# Patient Record
Sex: Male | Born: 1980 | Race: White | Hispanic: No | Marital: Single | Smoking: Never smoker
Health system: Southern US, Community
[De-identification: ages and names within clinical notes are randomized; demographics above are authoritative.]

---
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
## 2009-10-20 ENCOUNTER — Emergency Department (HOSPITAL_COMMUNITY): Admission: EM | Admit: 2009-10-20 | Discharge: 2009-10-20 | Payer: Self-pay | Admitting: Emergency Medicine

## 2010-03-20 LAB — CBC
HCT: 48.8 % (ref 39.0–52.0)
Hemoglobin: 16.8 g/dL (ref 13.0–17.0)
MCH: 32 pg (ref 26.0–34.0)
MCHC: 34.3 g/dL (ref 30.0–36.0)
MCV: 93.2 fL (ref 78.0–100.0)
Platelets: 191 10*3/uL (ref 150–400)
RBC: 5.24 MIL/uL (ref 4.22–5.81)
RDW: 12.5 % (ref 11.5–15.5)
WBC: 6.7 10*3/uL (ref 4.0–10.5)

## 2010-03-20 LAB — DIFFERENTIAL
Basophils Absolute: 0 10*3/uL (ref 0.0–0.1)
Basophils Relative: 1 % (ref 0–1)
Eosinophils Absolute: 0.2 10*3/uL (ref 0.0–0.7)
Eosinophils Relative: 2 % (ref 0–5)
Lymphocytes Relative: 20 % (ref 12–46)
Lymphs Abs: 1.3 10*3/uL (ref 0.7–4.0)
Monocytes Absolute: 0.8 10*3/uL (ref 0.1–1.0)
Monocytes Relative: 12 % (ref 3–12)
Neutro Abs: 4.4 10*3/uL (ref 1.7–7.7)
Neutrophils Relative %: 66 % (ref 43–77)

## 2010-03-20 LAB — POCT I-STAT, CHEM 8
BUN: 12 mg/dL (ref 6–23)
Calcium, Ion: 1.15 mmol/L (ref 1.12–1.32)
Chloride: 103 mEq/L (ref 96–112)
Creatinine, Ser: 0.9 mg/dL (ref 0.4–1.5)
Glucose, Bld: 116 mg/dL — ABNORMAL HIGH (ref 70–99)
HCT: 52 % (ref 39.0–52.0)
Hemoglobin: 17.7 g/dL — ABNORMAL HIGH (ref 13.0–17.0)
Potassium: 3.8 mEq/L (ref 3.5–5.1)
Sodium: 140 mEq/L (ref 135–145)
TCO2: 27 mmol/L (ref 0–100)

## 2014-09-27 ENCOUNTER — Ambulatory Visit (INDEPENDENT_AMBULATORY_CARE_PROVIDER_SITE_OTHER): Payer: BC Managed Care – PPO | Admitting: Emergency Medicine

## 2014-09-27 VITALS — BP 114/72 | HR 47 | Temp 98.1°F | Resp 16 | Ht 73.0 in | Wt 180.0 lb

## 2014-09-27 DIAGNOSIS — L600 Ingrowing nail: Secondary | ICD-10-CM | POA: Diagnosis not present

## 2014-09-27 DIAGNOSIS — Z23 Encounter for immunization: Secondary | ICD-10-CM

## 2014-09-27 NOTE — Progress Notes (Signed)
Procedure: Risk and benefits discussed and verbal consent obtained. The patient was anesthetized using 5 cc of 2% lidocaine via digital block.  The medial 1/4 of the right great toe nail was lifted and cut away. No retained toenail on inspection. Xeroform gauze placed. Clean dressing applied.  Deliah Boston, MS, PA-C   7:34 PM, 09/27/2014

## 2014-09-27 NOTE — Patient Instructions (Signed)
Infected Ingrown Toenail °An infected ingrown toenail occurs when the nail edge grows into the skin and bacteria invade the area. Symptoms include pain, tenderness, swelling, and pus drainage from the edge of the nail. Poorly fitting shoes, minor injuries, and improper cutting of the toenail may also contribute to the problem. You should cut your toenails squarely instead of rounding the edges. Do not cut them too short. Avoid tight or pointed toe shoes. Sometimes the ingrown portion of the nail must be removed. If your toenail is removed, it can take 3-4 months for it to re-grow. °HOME CARE INSTRUCTIONS  °· Soak your infected toe in warm water for 20-30 minutes, 2 to 3 times a day. °· Packing or dressings applied to the area should be changed daily. °· Take medicine as directed and finish them. °· Reduce activities and keep your foot elevated when able to reduce swelling and discomfort. Do this until the infection gets better. °· Wear sandals or go barefoot as much as possible while the infected area is sensitive. °· See your caregiver for follow-up care in 2-3 days if the infection is not better. °SEEK MEDICAL CARE IF:  °Your toe is becoming more red, swollen or painful. °MAKE SURE YOU:  °· Understand these instructions. °· Will watch your condition. °· Will get help right away if you are not doing well or get worse. °Document Released: 01/31/2004 Document Revised: 03/17/2011 Document Reviewed: 12/20/2007 °ExitCare® Patient Information ©2015 [employer]. This information is not intended to replace advice given to you by your health care provider. Make sure you discuss any questions you have with your health care provider. ° °

## 2014-09-27 NOTE — Progress Notes (Signed)
Subjective:  Patient ID: Johnny Ellison, male    DOB: [birth year]  Age: 34 y.o. MRN: [id number]  CC: Toe Pain and Flu Vaccine   HPI Johnny Ellison presents  with an ingrown right great toenail. He has pain in his toe with some drainage purulent material. As no fever or chills. No history of injury or overuse. He's not had a history of prior ingrowing nail. He has no swelling of his foot or redness. No cellulitis  History Johnny Ellison has no past medical history on file.   He has no past surgical history on file.   His  family history is not on file.  He   reports that he has never smoked. He has never used smokeless tobacco. He reports that he drinks about 2.4 oz of alcohol per week. He reports that he does not use illicit drugs.  No outpatient prescriptions prior to visit.   No facility-administered medications prior to visit.    Social History   Social History  . Marital Status: Single    Spouse Name: N/A  . Number of Children: N/A  . Years of Education: N/A   Social History Main Topics  . Smoking status: Never Smoker   . Smokeless tobacco: Never Used  . Alcohol Use: 2.4 oz/week    4 Standard drinks or equivalent per week  . Drug Use: No  . Sexual Activity: Not Asked   Other Topics Concern  . None   Social History Narrative  . None     Review of Systems  Constitutional: Negative for fever, chills and appetite change.  HENT: Negative for congestion, ear pain, postnasal drip, sinus pressure and sore throat.   Eyes: Negative for pain and redness.  Respiratory: Negative for cough, shortness of breath and wheezing.   Cardiovascular: Negative for leg swelling.  Gastrointestinal: Negative for nausea, vomiting, abdominal pain, diarrhea, constipation and blood in stool.  Endocrine: Negative for polyuria.  Genitourinary: Negative for dysuria, urgency, frequency and flank pain.  Musculoskeletal: Negative for gait problem.  Skin: Negative for rash.  Neurological:  Negative for weakness and headaches.  Psychiatric/Behavioral: Negative for confusion and decreased concentration. The patient is not nervous/anxious.     Objective:  BP 114/72 mmHg  Pulse 47  Temp(Src) 98.1 F (36.7 C) (Oral)  Resp 16  Ht  (1.854 m)  Wt 180 lb (81.647 kg)  BMI 23.75 kg/m2  SpO2 99%  Physical Exam  Constitutional: He is oriented to person, place, and time. He appears well-developed and well-nourished. No distress.  HENT:  Head: Normocephalic and atraumatic.  Right Ear: External ear normal.  Left Ear: External ear normal.  Nose: Nose normal.  Eyes: Conjunctivae and EOM are normal. Pupils are equal, round, and reactive to light. No scleral icterus.  Neck: Normal range of motion. Neck supple. No tracheal deviation present.  Cardiovascular: Normal rate, regular rhythm and normal heart sounds.   Pulmonary/Chest: Effort normal. No respiratory distress. He has no wheezes. He has no rales.  Abdominal: He exhibits no mass. There is no tenderness. There is no rebound and no guarding.  Musculoskeletal: He exhibits no edema.  Lymphadenopathy:    He has no cervical adenopathy.  Neurological: He is alert and oriented to person, place, and time. Coordination normal.  Skin: Skin is warm and dry. No rash noted.  Psychiatric: He has a normal mood and affect. His behavior is normal.   He has a right great toe ingrown nail on the medial nail fold.  There is cellulitis of the toe itself. And he has some purulent drainage.   Assessment & Plan:   Johnny Ellison was seen today for toe pain and flu vaccine.  Diagnoses and all orders for this visit:  Nail, ingrown  Need for prophylactic vaccination and inoculation against influenza  Other orders -     Flu Vaccine QUAD 36+ mos IM   Mr. Johnny Ellison does not currently have medications on file.  No orders of the defined types were placed in this encounter.    Appropriate red flag conditions were discussed with the patient as well as  actions that should be taken.  Patient expressed his understanding.  Follow-up: No Follow-up on file.  Carmelina Dane, MD

## 2016-11-06 ENCOUNTER — Ambulatory Visit (INDEPENDENT_AMBULATORY_CARE_PROVIDER_SITE_OTHER): Payer: BC Managed Care – PPO | Admitting: Emergency Medicine

## 2016-11-06 ENCOUNTER — Encounter: Payer: Self-pay | Admitting: Emergency Medicine

## 2016-11-06 VITALS — BP 100/58 | HR 67 | Temp 98.6°F | Resp 16 | Ht 72.25 in | Wt 176.0 lb

## 2016-11-06 DIAGNOSIS — J22 Unspecified acute lower respiratory infection: Secondary | ICD-10-CM

## 2016-11-06 DIAGNOSIS — R062 Wheezing: Secondary | ICD-10-CM

## 2016-11-06 DIAGNOSIS — R0989 Other specified symptoms and signs involving the circulatory and respiratory systems: Secondary | ICD-10-CM | POA: Diagnosis not present

## 2016-11-06 MED ORDER — PSEUDOEPHEDRINE-GUAIFENESIN ER 60-600 MG PO TB12: 1.0000 | ORAL_TABLET | Freq: Two times a day (BID) | ORAL | 1 refills | Status: AC

## 2016-11-06 MED ORDER — AZITHROMYCIN 250 MG PO TABS: ORAL_TABLET | ORAL | 0 refills | Status: DC

## 2016-11-06 MED ORDER — PREDNISONE 20 MG PO TABS: 40.0000 mg | ORAL_TABLET | Freq: Every day | ORAL | 0 refills | Status: AC

## 2016-11-06 NOTE — Patient Instructions (Addendum)
     IF you received an x-ray today, you will receive an invoice from [facility]. Please contact [facility] at [phone] with questions or concerns regarding your invoice.   IF you received labwork today, you will receive an invoice from [facility]. Please contact [facility] at [phone] with questions or concerns regarding your invoice.   Our billing staff will not be able to assist you with questions regarding bills from these companies.  You will be contacted with the lab results as soon as they are available. The fastest way to get your results is to activate your My Chart account. Instructions are located on the last page of this paperwork. If you have not heard from us regarding the results in 2 weeks, please contact this office.      Acute Bronchitis, Adult Acute bronchitis is when air tubes (bronchi) in the lungs suddenly get swollen. The condition can make it hard to breathe. It can also cause these symptoms:  A cough.  Coughing up clear, yellow, or green mucus.  Wheezing.  Chest congestion.  Shortness of breath.  A fever.  Body aches.  Chills.  A sore throat.  Follow these instructions at home: Medicines  Take over-the-counter and prescription medicines only as told by your doctor.  If you were prescribed an antibiotic medicine, take it as told by your doctor. Do not stop taking the antibiotic even if you start to feel better. General instructions  Rest.  Drink enough fluids to keep your pee (urine) clear or pale yellow.  Avoid smoking and secondhand smoke. If you smoke and you need help quitting, ask your doctor. Quitting will help your lungs heal faster.  Use an inhaler, cool mist vaporizer, or humidifier as told by your doctor.  Keep all follow-up visits as told by your doctor. This is important. How is this prevented? To lower your risk of getting this condition again:  Wash your hands often with soap and water. If you cannot  use soap and water, use hand sanitizer.  Avoid contact with people who have cold symptoms.  Try not to touch your hands to your mouth, nose, or eyes.  Make sure to get the flu shot every year.  Contact a doctor if:  Your symptoms do not get better in 2 weeks. Get help right away if:  You cough up blood.  You have chest pain.  You have very bad shortness of breath.  You become dehydrated.  You faint (pass out) or keep feeling like you are going to pass out.  You keep throwing up (vomiting).  You have a very bad headache.  Your fever or chills gets worse. This information is not intended to replace advice given to you by your health care provider. Make sure you discuss any questions you have with your health care provider. Document Released: 06/11/2007 Document Revised: 08/01/2015 Document Reviewed: 06/13/2015 Elsevier Interactive Patient Education  2017 Elsevier Inc.  

## 2016-11-06 NOTE — Progress Notes (Signed)
Johnny Ellison 36 y.o.   Chief Complaint  Patient presents with  . Cough    productive with light greenish mucus x 2 days  . Nasal Congestion    and chest    HISTORY OF PRESENT ILLNESS: This is a 36 y.o. 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  Cough  This is a new problem. The current episode started in the past 7 days. The problem has been gradually worsening. The cough is productive of sputum. Associated symptoms include chills, a fever and wheezing. Pertinent negatives include no chest pain, ear congestion, eye redness, headaches, hemoptysis, myalgias, rash, sore throat, shortness of breath or sweats. He has tried nothing for the symptoms. There is no history of asthma, bronchitis, COPD, emphysema or pneumonia.     Prior to Admission medications   Not on File    No Known Allergies  There are no active problems to display for this patient.   No past medical history on file.  No past surgical history on file.  Social History   Social History  . Marital status: Single    Spouse name: N/A  . Number of children: N/A  . Years of education: N/A   Occupational History  . Not on file.   Social History Main Topics  . Smoking status: Never Smoker  . Smokeless tobacco: Never Used  . Alcohol use 2.4 oz/week    4 Standard drinks or equivalent per week  . Drug use: No  . Sexual activity: Not on file   Other Topics Concern  . Not on file   Social History Narrative  . No narrative on file    No family history on file.   Review of Systems  Constitutional: Positive for chills and fever.  HENT: Positive for congestion. Negative for sore throat.   Eyes: Negative for discharge and redness.  Respiratory: Positive for cough, sputum production and wheezing. Negative for hemoptysis and shortness of breath.   Cardiovascular: Negative for chest pain, palpitations and leg swelling.  Gastrointestinal: Negative for abdominal pain, diarrhea,  nausea and vomiting.  Genitourinary: Negative.  Negative for dysuria.  Musculoskeletal: Negative for myalgias.  Skin: Negative for rash.  Neurological: Negative.  Negative for dizziness and headaches.  Endo/Heme/Allergies: Negative.   All other systems reviewed and are negative.  Vitals:   11/06/16 1105  BP: (!) 100/58  Pulse: 67  Resp: 16  Temp: 98.6 F (37 C)  SpO2: 97%     Physical Exam  Constitutional: He is oriented to person, place, and time. He appears well-developed and well-nourished.  HENT:  Head: Normocephalic and atraumatic.  Eyes: Pupils are equal, round, and reactive to light. EOM are normal.  Neck: Normal range of motion. Neck supple. No JVD present.  Cardiovascular: Normal rate and regular rhythm.   Pulmonary/Chest: Effort normal. He has wheezes. He has no rales.  Abdominal: Soft.  Musculoskeletal: Normal range of motion.  Lymphadenopathy:    He has no cervical adenopathy.  Neurological: He is alert and oriented to person, place, and time. No sensory deficit. He exhibits normal muscle tone.  Skin: Skin is warm and dry. Capillary refill takes less than 2 seconds. No rash noted.  Psychiatric: He has a normal mood and affect. His behavior is normal.  Vitals reviewed.    ASSESSMENT & PLAN: Johnny Ellison was seen today for cough and nasal congestion.  Diagnoses and all orders for this visit:  Lower resp. tract infection  Wheezing  Chest  congestion  Other orders -     predniSONE (DELTASONE) 20 MG tablet; Take 2 tablets (40 mg total) by mouth daily with breakfast. -     azithromycin (ZITHROMAX) 250 MG tablet; Sig as indicated -     pseudoephedrine-guaifenesin (MUCINEX D) 60-600 MG 12 hr tablet; Take 1 tablet by mouth every 12 (twelve) hours.    Patient Instructions       IF you received an x-ray today, you will receive an invoice from [facility]. Please contact [facility] at [phone] with questions or concerns regarding your  invoice.   IF you received labwork today, you will receive an invoice from [facility]. Please contact [facility] at [phone] with questions or concerns regarding your invoice.   Our billing staff will not be able to assist you with questions regarding bills from these companies.  You will be contacted with the lab results as soon as they are available. The fastest way to get your results is to activate your My Chart account. Instructions are located on the last page of this paperwork. If you have not heard from us regarding the results in 2 weeks, please contact this office.     Acute Bronchitis, Adult Acute bronchitis is when air tubes (bronchi) in the lungs suddenly get swollen. The condition can make it hard to breathe. It can also cause these symptoms:  A cough.  Coughing up clear, yellow, or green mucus.  Wheezing.  Chest congestion.  Shortness of breath.  A fever.  Body aches.  Chills.  A sore throat.  Follow these instructions at home: Medicines  Take over-the-counter and prescription medicines only as told by your doctor.  If you were prescribed an antibiotic medicine, take it as told by your doctor. Do not stop taking the antibiotic even if you start to feel better. General instructions  Rest.  Drink enough fluids to keep your pee (urine) clear or pale yellow.  Avoid smoking and secondhand smoke. If you smoke and you need help quitting, ask your doctor. Quitting will help your lungs heal faster.  Use an inhaler, cool mist vaporizer, or humidifier as told by your doctor.  Keep all follow-up visits as told by your doctor. This is important. How is this prevented? To lower your risk of getting this condition again:  Wash your hands often with soap and water. If you cannot use soap and water, use hand sanitizer.  Avoid contact with people who have cold symptoms.  Try not to touch your hands to your mouth, nose, or eyes.  Make sure to get the flu shot  every year.  Contact a doctor if:  Your symptoms do not get better in 2 weeks. Get help right away if:  You cough up blood.  You have chest pain.  You have very bad shortness of breath.  You become dehydrated.  You faint (pass out) or keep feeling like you are going to pass out.  You keep throwing up (vomiting).  You have a very bad headache.  Your fever or chills gets worse. This information is not intended to replace advice given to you by your health care provider. Make sure you discuss any questions you have with your health care provider. Document Released: 06/11/2007 Document Revised: 08/01/2015 Document Reviewed: 06/13/2015 Elsevier Interactive Patient Education  2017 Elsevier Inc.      Edwina BarthMiguel Sheera Illingworth, MD [facility]

## 2016-11-14 ENCOUNTER — Encounter: Payer: Self-pay | Admitting: Emergency Medicine

## 2016-11-14 ENCOUNTER — Ambulatory Visit (INDEPENDENT_AMBULATORY_CARE_PROVIDER_SITE_OTHER): Payer: BC Managed Care – PPO

## 2016-11-14 ENCOUNTER — Other Ambulatory Visit: Payer: Self-pay

## 2016-11-14 ENCOUNTER — Ambulatory Visit: Payer: BC Managed Care – PPO | Admitting: Emergency Medicine

## 2016-11-14 VITALS — BP 106/62 | HR 63 | Temp 97.4°F | Resp 16 | Ht 73.0 in | Wt 175.6 lb

## 2016-11-14 DIAGNOSIS — R05 Cough: Secondary | ICD-10-CM

## 2016-11-14 DIAGNOSIS — R0989 Other specified symptoms and signs involving the circulatory and respiratory systems: Secondary | ICD-10-CM | POA: Diagnosis not present

## 2016-11-14 DIAGNOSIS — R059 Cough, unspecified: Secondary | ICD-10-CM | POA: Insufficient documentation

## 2016-11-14 DIAGNOSIS — R062 Wheezing: Secondary | ICD-10-CM

## 2016-11-14 DIAGNOSIS — J22 Unspecified acute lower respiratory infection: Secondary | ICD-10-CM

## 2016-11-14 MED ORDER — PREDNISONE 20 MG PO TABS: 40.0000 mg | ORAL_TABLET | Freq: Every day | ORAL | 0 refills | Status: AC

## 2016-11-14 MED ORDER — ALBUTEROL SULFATE HFA 108 (90 BASE) MCG/ACT IN AERS: 2.0000 | INHALATION_SPRAY | Freq: Four times a day (QID) | RESPIRATORY_TRACT | 0 refills | Status: DC | PRN

## 2016-11-14 NOTE — Patient Instructions (Addendum)
   IF you received an x-ray today, you will receive an invoice from [facility]. Please contact [facility] at [phone] with questions or concerns regarding your invoice.   IF you received labwork today, you will receive an invoice from [facility]. Please contact [facility] at [phone] with questions or concerns regarding your invoice.   Our billing staff will not be able to assist you with questions regarding bills from these companies.  You will be contacted with the lab results as soon as they are available. The fastest way to get your results is to activate your My Chart account. Instructions are located on the last page of this paperwork. If you have not heard from us regarding the results in 2 weeks, please contact this office.      Bronchospasm, Adult Bronchospasm is a tightening of the airways going into the lungs. During an episode, it may be harder to breathe. You may cough, and you may make a whistling sound when you breathe (wheeze). This condition often affects people with asthma. What are the causes? This condition is caused by swelling and irritation in the airways. It can be triggered by:  An infection (common).  Seasonal allergies.  An allergic reaction.  Exercise.  Irritants. These include pollution, cigarette smoke, strong odors, aerosol sprays, and paint fumes.  Weather changes. Winds increase molds and pollens in the air. Cold air may cause swelling.  Stress and emotional upset.  What are the signs or symptoms? Symptoms of this condition include:  Wheezing. If the episode was triggered by an allergy, wheezing may start right away or hours later.  Nighttime coughing.  Frequent or severe coughing with a simple cold.  Chest tightness.  Shortness of breath.  Decreased ability to exercise.  How is this diagnosed? This condition is usually diagnosed with a review of your medical history and a physical exam. Tests, such as lung  function tests, are sometimes done to look for other conditions. The need for a chest X-ray depends on where the wheezing occurs and whether it is the first time you have wheezed. How is this treated? This condition may be treated with:  Inhaled medicines. These open up the airways and help you breathe. They can be taken with an inhaler or a nebulizer device.  Corticosteroid medicines. These may be given for severe bronchospasm, usually when it is associated with asthma.  Avoiding triggers, such as irritants, infection, or allergies.  Follow these instructions at home: Medicines  Take over-the-counter and prescription medicines only as told by your health care provider.  If you need to use an inhaler or nebulizer to take your medicine, ask your health care provider to explain how to use it correctly. If you were given a spacer, always use it with your inhaler. Lifestyle  Reduce the number of triggers in your home. To do this: ? Change your heating and air conditioning filter at least once a month. ? Limit your use of fireplaces and wood stoves. ? Do not smoke. Do not allow smoking in your home. ? Avoid using perfumes and fragrances. ? Get rid of pests, such as roaches and mice, and their droppings. ? Remove any mold from your home. ? Keep your house clean and dust free. Use unscented cleaning products. ? Replace carpet with wood, tile, or vinyl flooring. Carpet can trap dander and dust. ? Use allergy-proof pillows, mattress covers, and box spring covers. ? Wash bed sheets and blankets every week in hot water. Dry them in   a dryer. ? Use blankets that are made of polyester or cotton. ? Wash your hands often. ? Do not allow pets in your bedroom.  Avoid breathing in cold air when you exercise. General instructions  Have a plan for seeking medical care. Know when to call your health care provider and local emergency services, and where to get emergency care.  Stay up to date on your  immunizations.  When you have an episode of bronchospasm, stay calm. Try to relax and breathe more slowly.  If you have asthma, make sure you have an asthma action plan.  Keep all follow-up visits as told by your health care provider. This is important. Contact a health care provider if:  You have muscle aches.  You have chest pain.  The mucus that you cough up (sputum) changes from clear or white to yellow, green, gray, or bloody.  You have a fever.  Your sputum gets thicker. Get help right away if:  Your wheezing and coughing get worse, even after you take your prescribed medicines.  It gets even harder to breathe.  You develop severe chest pain. Summary  Bronchospasm is a tightening of the airways going into the lungs.  During an episode of bronchospasm, you may have a harder time breathing. You may cough and make a whistling sound when you breathe (wheeze).  Avoid exposure to triggers such as smoke, dust, mold, animal dander, and fragrances.  When you have an episode of bronchospasm, stay calm. Try to relax and breathe more slowly. This information is not intended to replace advice given to you by your health care provider. Make sure you discuss any questions you have with your health care provider. Document Released: 12/26/2002 Document Revised: 12/20/2015 Document Reviewed: 12/20/2015 Elsevier Interactive Patient Education  2017 Elsevier Inc.  

## 2016-11-14 NOTE — Progress Notes (Signed)
Johnny Ellison 36 y.o.   Chief Complaint  Patient presents with  . Follow-up    respiratory infection - per patient he had a fever 100.6 degrees    HISTORY OF PRESENT ILLNESS: This is a 36 y.o. male complaining of fever last 2 nights, lungs still " junked up", worse in afternoon/evening; seen by me 8 days ago with same and started on Zithromax and Prednisone. No new symptoms but still sick.  HPI   Prior to Admission medications   Medication Sig Start Date End Date Taking? Authorizing Provider  azithromycin (ZITHROMAX) 250 MG tablet Sig as indicated Patient not taking: Reported on 11/14/2016 11/06/16   Johnny Quint, MD    No Known Allergies  Patient Active Problem List   Diagnosis Date Noted  . Lower resp. tract infection 11/06/2016  . Wheezing 11/06/2016  . Chest congestion 11/06/2016    No past medical history on file.  No past surgical history on file.  Social History   Socioeconomic History  . Marital status: Single    Spouse name: Not on file  . Number of children: Not on file  . Years of education: Not on file  . Highest education level: Not on file  Social Needs  . Financial resource strain: Not on file  . Food insecurity - worry: Not on file  . Food insecurity - inability: Not on file  . Transportation needs - medical: Not on file  . Transportation needs - non-medical: Not on file  Occupational History  . Not on file  Tobacco Use  . Smoking status: Never Smoker  . Smokeless tobacco: Never Used  Substance and Sexual Activity  . Alcohol use: Yes    Alcohol/week: 2.4 oz    Types: 4 Standard drinks or equivalent per week  . Drug use: No  . Sexual activity: Not on file  Other Topics Concern  . Not on file  Social History Narrative  . Not on file    No family history on file.   Review of Systems  Constitutional: Positive for fever and malaise/fatigue. Negative for chills.  HENT: Positive for congestion (mostly chest). Negative for sore  throat.   Eyes: Negative for discharge and redness.  Respiratory: Positive for cough, shortness of breath and wheezing. Negative for hemoptysis and sputum production.        DOE.  Cardiovascular: Negative for chest pain and leg swelling.  Gastrointestinal: Negative for abdominal pain, diarrhea, nausea and vomiting.  Genitourinary: Negative for dysuria and hematuria.  Musculoskeletal: Negative for back pain, joint pain and myalgias.  Skin: Negative for rash.  Neurological: Negative for dizziness and headaches.  Endo/Heme/Allergies: Negative.   All other systems reviewed and are negative.  Vitals:   11/14/16 1347  BP: 106/62  Pulse: 63  Resp: 16  Temp: (!) 97.4 F (36.3 C)  SpO2: 96%     Physical Exam  Constitutional: He is oriented to person, place, and time. He appears well-developed and well-nourished.  HENT:  Head: Normocephalic and atraumatic.  Nose: Nose normal.  Mouth/Throat: Oropharynx is clear and moist.  Eyes: Conjunctivae and EOM are normal. Pupils are equal, round, and reactive to light.  Neck: Normal range of motion. Neck supple. No JVD present. No thyromegaly present.  Cardiovascular: Normal rate, regular rhythm, normal heart sounds and intact distal pulses.  Pulmonary/Chest: Effort normal. No respiratory distress. He has wheezes.  Abdominal: Soft. Bowel sounds are normal. There is no tenderness.  Musculoskeletal: Normal range of motion.  Lymphadenopathy:  He has no cervical adenopathy.  Neurological: He is alert and oriented to person, place, and time. No sensory deficit. He exhibits normal muscle tone.  Skin: Skin is warm and dry. Capillary refill takes less than 2 seconds. No rash noted.  Psychiatric: He has a normal mood and affect. His behavior is normal.  Vitals reviewed.    ASSESSMENT & PLAN: Johnny Ellison was seen today for follow-up.  Diagnoses and all orders for this visit:  Lower resp. tract infection -     CBC with Differential/Platelet -      Comprehensive metabolic panel -     DG Chest 2 View; Future  Wheezing  Chest congestion  Cough  Other orders -     predniSONE (DELTASONE) 20 MG tablet; Take 2 tablets (40 mg total) daily with breakfast for 5 days by mouth. -     albuterol (PROVENTIL HFA;VENTOLIN HFA) 108 (90 Base) MCG/ACT inhaler; Inhale 2 puffs every 6 (six) hours as needed into the lungs for wheezing or shortness of breath.    Patient Instructions       IF you received an x-ray today, you will [facility] an [facility]. Please contact [facility] at [phone] with questions or concerns regarding your invoice.   IF you received labwork today, you will receive an invoice from [facility]. Please contact [facility] at [phone] with questions or concerns regarding your invoice.   Our billing staff will not be able to assist you with questions regarding bills from these companies.  You will be contacted with the lab results as soon as they are available. The fastest way to get your results is to activate your My Chart account. Instructions are located on the last page of this paperwork. If you have not heard from us regarding the results in 2 weeks, please contact this office.     Bronchospasm, Adult Bronchospasm is a tightening of the airways going into the lungs. During an episode, it may be harder to breathe. You may cough, and you may make a whistling sound when you breathe (wheeze). This condition often affects people with asthma. What are the causes? This condition is caused by swelling and irritation in the airways. It can be triggered by:  An infection (common).  Seasonal allergies.  An allergic reaction.  Exercise.  Irritants. These include pollution, cigarette smoke, strong odors, aerosol sprays, and paint fumes.  Weather changes. Winds increase molds and pollens in the air. Cold air may cause swelling.  Stress and emotional upset.  What are the signs or  symptoms? Symptoms of this condition include:  Wheezing. If the episode was triggered by an allergy, wheezing may start right away or hours later.  Nighttime coughing.  Frequent or severe coughing with a simple cold.  Chest tightness.  Shortness of breath.  Decreased ability to exercise.  How is this diagnosed? This condition is usually diagnosed with a review of your medical history and a physical exam. Tests, such as lung function tests, are sometimes done to look for other conditions. The need for a chest X-ray depends on where the wheezing occurs and whether it is the first time you have wheezed. How is this treated? This condition may be treated with:  Inhaled medicines. These open up the airways and help you breathe. They can be taken with an inhaler or a nebulizer device.  Corticosteroid medicines. These may be given for severe bronchospasm, usually when it is associated with asthma.  Avoiding triggers, such as irritants, infection, or  allergies.  Follow these instructions at home: Medicines  Take over-the-counter and prescription medicines only as told by your health care provider.  If you need to use an inhaler or nebulizer to take your medicine, ask your health care provider to explain how to use it correctly. If you were given a spacer, always use it with your inhaler. Lifestyle  Reduce the number of triggers in your home. To do this: ? Change your heating and air conditioning filter at least once a month. ? Limit your use of fireplaces and wood stoves. ? Do not smoke. Do not allow smoking in your home. ? Avoid using perfumes and fragrances. ? Get rid of pests, such as roaches and mice, and their droppings. ? Remove any mold from your home. ? Keep your house clean and dust free. Use unscented cleaning products. ? Replace carpet with wood, tile, or vinyl flooring. Carpet can trap dander and dust. ? Use allergy-proof pillows, mattress covers, and box spring  covers. ? Wash bed sheets and blankets every week in hot water. Dry them in a dryer. ? Use blankets that are made of polyester or cotton. ? Wash your hands often. ? Do not allow pets in your bedroom.  Avoid breathing in cold air when you exercise. General instructions  Have a plan for seeking medical care. Know when to call your health care provider and local emergency services, and where to get emergency care.  Stay up to date on your immunizations.  When you have an episode of bronchospasm, stay calm. Try to relax and breathe more slowly.  If you have asthma, make sure you have an asthma action plan.  Keep all follow-up visits as told by your health care provider. This is important. Contact a health care provider if:  You have muscle aches.  You have chest pain.  The mucus that you cough up (sputum) changes from clear or white to yellow, green, gray, or bloody.  You have a fever.  Your sputum gets thicker. Get help right away if:  Your wheezing and coughing get worse, even after you take your prescribed medicines.  It gets even harder to breathe.  You develop severe chest pain. Summary  Bronchospasm is a tightening of the airways going into the lungs.  During an episode of bronchospasm, you may have a harder time breathing. You may cough and make a whistling sound when you breathe (wheeze).  Avoid exposure to triggers such as smoke, dust, mold, animal dander, and fragrances.  When you have an episode of bronchospasm, stay calm. Try to relax and breathe more slowly. This information is not intended to replace advice given to you by your health care provider. Make sure you discuss any questions you have with your health care provider. Document Released: 12/26/2002 Document Revised: 12/20/2015 Document Reviewed: 12/20/2015 Elsevier Interactive Patient Education  2017 Elsevier Inc.      Edwina BarthMiguel Beckham Buxbaum, MD [facility]

## 2016-11-15 LAB — CBC WITH DIFFERENTIAL/PLATELET
BASOS ABS: 0.1 10*3/uL (ref 0.0–0.2)
BASOS: 2 %
EOS (ABSOLUTE): 0.3 10*3/uL (ref 0.0–0.4)
EOS: 5 %
Hematocrit: 45.1 % (ref 37.5–51.0)
Hemoglobin: 15.6 g/dL (ref 13.0–17.7)
IMMATURE GRANS (ABS): 0 10*3/uL (ref 0.0–0.1)
IMMATURE GRANULOCYTES: 0 %
LYMPHS: 47 %
Lymphocytes Absolute: 2.9 10*3/uL (ref 0.7–3.1)
MCH: 31.9 pg (ref 26.6–33.0)
MCHC: 34.6 g/dL (ref 31.5–35.7)
MCV: 92 fL (ref 79–97)
MONOCYTES: 11 %
MONOS ABS: 0.7 10*3/uL (ref 0.1–0.9)
NEUTROS PCT: 35 %
Neutrophils Absolute: 2.1 10*3/uL (ref 1.4–7.0)
PLATELETS: 197 10*3/uL (ref 150–379)
RBC: 4.89 x10E6/uL (ref 4.14–5.80)
RDW: 13.2 % (ref 12.3–15.4)
WBC: 6.1 10*3/uL (ref 3.4–10.8)

## 2016-11-15 LAB — COMPREHENSIVE METABOLIC PANEL
ALT: 138 IU/L — AB (ref 0–44)
AST: 107 IU/L — AB (ref 0–40)
Albumin/Globulin Ratio: 1.7 (ref 1.2–2.2)
Albumin: 4.2 g/dL (ref 3.5–5.5)
Alkaline Phosphatase: 77 IU/L (ref 39–117)
BILIRUBIN TOTAL: 0.5 mg/dL (ref 0.0–1.2)
BUN/Creatinine Ratio: 19 (ref 9–20)
BUN: 16 mg/dL (ref 6–20)
CHLORIDE: 101 mmol/L (ref 96–106)
CO2: 24 mmol/L (ref 20–29)
Calcium: 8.9 mg/dL (ref 8.7–10.2)
Creatinine, Ser: 0.85 mg/dL (ref 0.76–1.27)
GFR calc Af Amer: 131 mL/min/{1.73_m2} (ref >59)
GFR calc non Af Amer: 114 mL/min/{1.73_m2} (ref >59)
GLOBULIN, TOTAL: 2.5 g/dL (ref 1.5–4.5)
GLUCOSE: 89 mg/dL (ref 65–99)
Potassium: 4.5 mmol/L (ref 3.5–5.2)
Sodium: 140 mmol/L (ref 134–144)
Total Protein: 6.7 g/dL (ref 6.0–8.5)

## 2016-11-18 ENCOUNTER — Other Ambulatory Visit: Payer: Self-pay | Admitting: Emergency Medicine

## 2016-11-18 MED ORDER — ALBUTEROL SULFATE HFA 108 (90 BASE) MCG/ACT IN AERS: 2.0000 | INHALATION_SPRAY | Freq: Four times a day (QID) | RESPIRATORY_TRACT | 2 refills | Status: AC | PRN

## 2017-08-05 ENCOUNTER — Encounter: Payer: Self-pay | Admitting: Family Medicine

## 2017-08-05 ENCOUNTER — Ambulatory Visit: Payer: BC Managed Care – PPO | Admitting: Family Medicine

## 2017-08-05 VITALS — BP 109/61 | HR 51 | Temp 98.5°F | Resp 17 | Ht 74.5 in | Wt 176.0 lb

## 2017-08-05 DIAGNOSIS — J029 Acute pharyngitis, unspecified: Secondary | ICD-10-CM

## 2017-08-05 LAB — POCT SKIN KOH: Skin KOH, POC: NEGATIVE

## 2017-08-05 LAB — POCT RAPID STREP A (OFFICE): Rapid Strep A Screen: NEGATIVE

## 2017-08-05 MED ORDER — MAGIC MOUTHWASH W/LIDOCAINE: 5.0000 mL | Freq: Four times a day (QID) | ORAL | 0 refills | Status: DC | PRN

## 2017-08-05 MED ORDER — AMOXICILLIN-POT CLAVULANATE 875-125 MG PO TABS: 1.0000 | ORAL_TABLET | Freq: Two times a day (BID) | ORAL | 0 refills | Status: DC

## 2017-08-05 NOTE — Progress Notes (Signed)
I,Johnny Ellison,acting as a scribe for Johnny MochaEva N Shaw, MD.,have documented all relevant documentation on the behalf of Johnny MochaEva N Shaw, MD,as directed by  Johnny MochaEva N Shaw, MD while in the presence of Johnny MochaEva N Shaw, MD. 08/05/2017  2:52 PM  Subjective:    Patient ID: Johnny Ellison, male    DOB: 1980-04-06, 37 y.o.   MRN: [id number]   Chief Complaint  Patient presents with  . Sore Throat    HPI Johnny Ellison is a 37 y.o. male who presents to Primary Care at [facility] complaining of sore throat that started about 3-4 days ago. He tried gargling with salt water, but this felt worse. He also took Careers adviserallegra, with no improvement  He has been able to eat and drink, but this is painful. He had a prior history of mononucleosis. He also had some thrush and white sores under his tongue. He has not recently taken antibiotics.   Patient Active Problem List   Diagnosis Date Noted  . Cough 11/14/2016  . Lower resp. tract infection 11/06/2016  . Wheezing 11/06/2016  . Chest congestion 11/06/2016   History reviewed. No pertinent past medical history. History reviewed. No pertinent surgical history. No Known Allergies Prior to Admission medications   Medication Sig Start Date End Date Taking? Authorizing Provider  albuterol (PROVENTIL HFA;VENTOLIN HFA) 108 (90 Base) MCG/ACT inhaler Inhale 2 puffs every 6 (six) hours as needed into the lungs for wheezing or shortness of breath. 11/14/16   Georgina QuintSagardia, Miguel Jose, MD  albuterol (PROVENTIL HFA;VENTOLIN HFA) 108 (90 Base) MCG/ACT inhaler Inhale 2 puffs every 6 (six) hours as needed into the lungs for wheezing or shortness of breath. 11/18/16   Georgina QuintSagardia, Miguel Jose, MD   Social History   Socioeconomic History  . Marital status: Single    Spouse name: Not on file  . Number of children: Not on file  . Years of education: Not on file  . Highest education level: Not on file  Occupational History  . Not on file  Social Needs  . Financial resource strain: Not on file    . Food insecurity:    Worry: Not on file    Inability: Not on file  . Transportation needs:    Medical: Not on file    Non-medical: Not on file  Tobacco Use  . Smoking status: Never Smoker  . Smokeless tobacco: Never Used  Substance and Sexual Activity  . Alcohol use: Yes    Alcohol/week: 2.4 oz    Types: 4 Standard drinks or equivalent per week  . Drug use: No  . Sexual activity: Not on file  Lifestyle  . Physical activity:    Days per week: Not on file    Minutes per session: Not on file  . Stress: Not on file  Relationships  . Social connections:    Talks on phone: Not on file    Gets together: Not on file    Attends religious service: Not on file    Active member of club or organization: Not on file    Attends meetings of clubs or organizations: Not on file    Relationship status: Not on file  . Intimate partner violence:    Fear of current or ex partner: Not on file    Emotionally abused: Not on file    Physically abused: Not on file    Forced sexual activity: Not on file  Other Topics Concern  . Not on file  Social History Narrative  .  Not on file     Review of Systems  Constitutional: Negative for chills, fatigue and fever.  HENT: Positive for sore throat. Negative for congestion, ear pain, mouth sores, postnasal drip and sneezing.   Respiratory: Negative for cough and chest tightness.   Cardiovascular: Negative for chest pain and palpitations.  Gastrointestinal: Negative for abdominal pain, constipation and nausea.  Genitourinary: Negative for dysuria, frequency and urgency.  Musculoskeletal: Negative for arthralgias and myalgias.  Skin: Negative for pallor.  Neurological: Negative for dizziness, light-headedness and headaches.  Psychiatric/Behavioral: Negative for agitation and dysphoric mood. The patient is not nervous/anxious.        Objective:   Physical Exam  Constitutional: He is oriented to person, place, and time. He appears well-developed and  well-nourished.  HENT:  Head: Normocephalic.  Right Ear: Tympanic membrane normal.  Left Ear: Tympanic membrane normal.  Mouth/Throat: Posterior oropharyngeal erythema present.  Ulceration and pustles  Eyes: Pupils are equal, round, and reactive to light. EOM are normal.  Neck: No thyromegaly present.  Cardiovascular: Normal rate, regular rhythm and normal heart sounds.  No murmur heard. Pulmonary/Chest: Effort normal and breath sounds normal.  Abdominal: Soft. Bowel sounds are normal.  Lymphadenopathy:    He has no cervical adenopathy.  Neurological: He is alert and oriented to person, place, and time.  Psychiatric: He has a normal mood and affect. His behavior is normal.    Vitals:   08/05/17 1451  BP: 109/61  Pulse: (!) 51  Resp: 17  Temp: 98.5 F (36.9 C)  SpO2: 98%      Results for orders placed or performed in visit on 08/05/17  Culture, Group A Strep  Result Value Ref Range   Strep A Culture Negative   POCT rapid strep A  Result Value Ref Range   Rapid Strep A Screen Negative Negative  POCT Skin KOH  Result Value Ref Range   Skin KOH, POC Negative Negative    Assessment & Plan:  . 1. Acute pharyngitis, unspecified etiology   Will trx as sxs/presentation consistent with bacterial infection  Orders Placed This Encounter  Procedures  . Culture, Group A Strep    Order Specific Question:   Source    Answer:   oropharynx  . POCT rapid strep A  . POCT Skin KOH    Meds ordered this encounter  Medications  . amoxicillin-clavulanate (AUGMENTIN) 875-125 MG tablet    Sig: Take 1 tablet by mouth 2 (two) times daily.    Dispense:  20 tablet    Refill:  0  . magic mouthwash w/lidocaine SOLN    Sig: Take 5-10 mLs by mouth 4 (four) times daily as needed for mouth pain. Hold in mouth to gargle and swish for as long as possible - then spit    Dispense:  500 mL    Refill:  0    Ok to use pharmacy formulary with diphenhydramine, malox, nystatin, and lidocaine - if  no formulary then just make in a 1:1:1:1 ratio    Norberto Sorenson, MD, MPH [facility] Care at [facility] [address] [place], [place]  [address] [phone] Office phone  [phone] Office fax   10/11/17 12:51 AM

## 2017-08-05 NOTE — Patient Instructions (Addendum)
   IF you received an x-ray today, you will receive an invoice from [facility]. Please contact [facility] at [phone] with questions or concerns regarding your invoice.   IF you received labwork today, you will receive an invoice from [facility]. Please contact [facility] at [phone] with questions or concerns regarding your invoice.   Our billing staff will not be able to assist you with questions regarding bills from these companies.  You will be contacted with the lab results as soon as they are available. The fastest way to get your results is to activate your My Chart account. Instructions are located on the last page of this paperwork. If you have not heard from us regarding the results in 2 weeks, please contact this office.     Strep Throat Strep throat is a bacterial infection of the throat. Your health care provider may call the infection tonsillitis or pharyngitis, depending on whether there is swelling in the tonsils or at the back of the throat. Strep throat is most common during the cold months of the year in children who are 5-15 years of age, but it can happen during any season in people of any age. This infection is spread from person to person (contagious) through coughing, sneezing, or close contact. What are the causes? Strep throat is caused by the bacteria called Streptococcus pyogenes. What increases the risk? This condition is more likely to develop in:  People who spend time in crowded places where the infection can spread easily.  People who have close contact with someone who has strep throat.  What are the signs or symptoms? Symptoms of this condition include:  Fever or chills.  Redness, swelling, or pain in the tonsils or throat.  Pain or difficulty when swallowing.  White or yellow spots on the tonsils or throat.  Swollen, tender glands in the neck or under the jaw.  Red rash all over the body (rare).  How is this  diagnosed? This condition is diagnosed by performing a rapid strep test or by taking a swab of your throat (throat culture test). Results from a rapid strep test are usually ready in a few minutes, but throat culture test results are available after one or two days. How is this treated? This condition is treated with antibiotic medicine. Follow these instructions at home: Medicines  Take over-the-counter and prescription medicines only as told by your health care provider.  Take your antibiotic as told by your health care provider. Do not stop taking the antibiotic even if you start to feel better.  Have family members who also have a sore throat or fever tested for strep throat. They may need antibiotics if they have the strep infection. Eating and drinking  Do not share food, drinking cups, or personal items that could cause the infection to spread to other people.  If swallowing is difficult, try eating soft foods until your sore throat feels better.  Drink enough fluid to keep your urine clear or pale yellow. General instructions  Gargle with a salt-water mixture 3-4 times per day or as needed. To make a salt-water mixture, completely dissolve -1 tsp of salt in 1 cup of warm water.  Make sure that all household members wash their hands well.  Get plenty of rest.  Stay home from school or work until you have been taking antibiotics for 24 hours.  Keep all follow-up visits as told by your health care provider. This is important. Contact a health care provider   if:  The glands in your neck continue to get bigger.  You develop a rash, cough, or earache.  You cough up a thick liquid that is green, yellow-brown, or bloody.  You have pain or discomfort that does not get better with medicine.  Your problems seem to be getting worse rather than better.  You have a fever. Get help right away if:  You have new symptoms, such as vomiting, severe headache, stiff or painful neck,  chest pain, or shortness of breath.  You have severe throat pain, drooling, or changes in your voice.  You have swelling of the neck, or the skin on the neck becomes red and tender.  You have signs of dehydration, such as fatigue, dry mouth, and decreased urination.  You become increasingly sleepy, or you cannot wake up completely.  Your joints become red or painful. This information is not intended to replace advice given to you by your health care provider. Make sure you discuss any questions you have with your health care provider. Document Released: 12/21/1999 Document Revised: 08/22/2015 Document Reviewed: 04/17/2014 Elsevier Interactive Patient Education  [employer]2018 Elsevier Inc.

## 2017-08-07 LAB — CULTURE, GROUP A STREP: Strep A Culture: NEGATIVE

## 2018-12-22 ENCOUNTER — Ambulatory Visit: Payer: BC Managed Care – PPO | Admitting: Emergency Medicine

## 2018-12-22 ENCOUNTER — Other Ambulatory Visit: Payer: Self-pay

## 2018-12-22 ENCOUNTER — Encounter: Payer: Self-pay | Admitting: Emergency Medicine

## 2018-12-22 VITALS — BP 110/68 | HR 48 | Temp 98.3°F | Resp 16 | Ht 74.0 in | Wt 180.8 lb

## 2018-12-22 DIAGNOSIS — Z0001 Encounter for general adult medical examination with abnormal findings: Secondary | ICD-10-CM | POA: Diagnosis not present

## 2018-12-22 DIAGNOSIS — Z23 Encounter for immunization: Secondary | ICD-10-CM | POA: Diagnosis not present

## 2018-12-22 DIAGNOSIS — Z13228 Encounter for screening for other metabolic disorders: Secondary | ICD-10-CM

## 2018-12-22 DIAGNOSIS — Z0000 Encounter for general adult medical examination without abnormal findings: Secondary | ICD-10-CM

## 2018-12-22 DIAGNOSIS — Z130 Encounter for screening for diseases of the blood and blood-forming organs and certain disorders involving the immune mechanism: Secondary | ICD-10-CM | POA: Diagnosis not present

## 2018-12-22 DIAGNOSIS — Z114 Encounter for screening for human immunodeficiency virus [HIV]: Secondary | ICD-10-CM | POA: Diagnosis not present

## 2018-12-22 DIAGNOSIS — Z13220 Encounter for screening for lipoid disorders: Secondary | ICD-10-CM

## 2018-12-22 DIAGNOSIS — Z1329 Encounter for screening for other suspected endocrine disorder: Secondary | ICD-10-CM

## 2018-12-22 DIAGNOSIS — M7989 Other specified soft tissue disorders: Secondary | ICD-10-CM

## 2018-12-22 NOTE — Progress Notes (Signed)
Johnny Ellison 38 y.o.   Chief Complaint  Patient presents with  . Establish Care    last office visit with Dr Johnny Ellison 11/2016    HISTORY OF PRESENT ILLNESS: This is a 38 y.o. male here for annual exam and to establish care with me. Has no complaints or medical concerns today. Healthy male with a healthy lifestyle. Non-smoker and no EtOH abuser. Good nutrition and good sleeping habits.  HPI   Prior to Admission medications   Medication Sig Start Date End Date Taking? Authorizing Provider  albuterol (PROVENTIL HFA;VENTOLIN HFA) 108 (90 Base) MCG/ACT inhaler Inhale 2 puffs every 6 (six) hours as needed into the lungs for wheezing or shortness of breath. 11/18/16  Yes Johnny Kempf, MD  amoxicillin-clavulanate (AUGMENTIN) 875-125 MG tablet Take 1 tablet by mouth 2 (two) times daily. 08/05/17  Yes Johnny Mocha, MD  magic mouthwash w/lidocaine SOLN Take 5-10 mLs by mouth 4 (four) times daily as needed for mouth pain. Hold in mouth to gargle and swish for as long as possible - then spit 08/05/17  Yes Johnny Mocha, MD    No Known Allergies  There are no problems to display for this patient.   History reviewed. No pertinent past medical history.  History reviewed. No pertinent surgical history.  Social History   Socioeconomic History  . Marital status: Single    Spouse name: Not on file  . Number of children: Not on file  . Years of education: Not on file  . Highest education level: Not on file  Occupational History  . Not on file  Tobacco Use  . Smoking status: Never Smoker  . Smokeless tobacco: Never Used  Substance and Sexual Activity  . Alcohol use: Yes    Alcohol/week: 4.0 standard drinks    Types: 4 Standard drinks or equivalent per week  . Drug use: No  . Sexual activity: Not on file  Other Topics Concern  . Not on file  Social History Narrative  . Not on file   Social Determinants of Health   Financial Resource Strain:   . Difficulty of Paying  Living Expenses: Not on file  Food Insecurity:   . Worried About Programme researcher, broadcasting/film/video in the Last Year: Not on file  . Ran Out of Food in the Last Year: Not on file  Transportation Needs:   . Lack of Transportation (Medical): Not on file  . Lack of Transportation (Non-Medical): Not on file  Physical Activity:   . Days of Exercise per Week: Not on file  . Minutes of Exercise per Session: Not on file  Stress:   . Feeling of Stress : Not on file  Social Connections:   . Frequency of Communication with Friends and Family: Not on file  . Frequency of Social Gatherings with Friends and Family: Not on file  . Attends Religious Services: Not on file  . Active Member of Clubs or Organizations: Not on file  . Attends Banker Meetings: Not on file  . Marital Status: Not on file  Intimate Partner Violence:   . Fear of Current or Ex-Partner: Not on file  . Emotionally Abused: Not on file  . Physically Abused: Not on file  . Sexually Abused: Not on file    History reviewed. No pertinent family history.   Review of Systems  Constitutional: Negative.  Negative for chills and fever.  HENT: Negative.  Negative for congestion and sore throat.   Eyes: Negative.  Respiratory: Negative.  Negative for cough and shortness of breath.   Cardiovascular: Negative.  Negative for chest pain and palpitations.  Gastrointestinal: Negative.  Negative for abdominal pain, blood in stool, diarrhea, melena, nausea and vomiting.  Genitourinary: Negative.  Negative for dysuria and hematuria.  Musculoskeletal: Negative.  Negative for myalgias.  Skin: Negative.  Negative for rash.       Soft tissue mass to his back, chronic  Neurological: Negative.  Negative for dizziness and headaches.  All other systems reviewed and are negative.  Vitals:   12/22/18 1341  BP: 110/68  Pulse: (!) 48  Resp: 16  Temp: 98.3 F (36.8 C)  SpO2: 98%     Physical Exam Vitals reviewed.  Constitutional:       Appearance: Normal appearance.  HENT:     Head: Normocephalic.  Eyes:     Extraocular Movements: Extraocular movements intact.     Conjunctiva/sclera: Conjunctivae normal.     Pupils: Pupils are equal, round, and reactive to light.  Cardiovascular:     Rate and Rhythm: Normal rate and regular rhythm.     Pulses: Normal pulses.     Heart sounds: Normal heart sounds.  Pulmonary:     Effort: Pulmonary effort is normal.     Breath sounds: Normal breath sounds.  Abdominal:     General: Bowel sounds are normal. There is no distension.     Palpations: Abdomen is soft. There is no mass.     Tenderness: There is no abdominal tenderness.  Musculoskeletal:        General: Normal range of motion.     Cervical back: Normal range of motion and neck supple.     Right lower leg: No edema.     Left lower leg: No edema.  Skin:    General: Skin is warm and dry.     Capillary Refill: Capillary refill takes less than 2 seconds.     Comments: Large soft tissue mass to right side of his mid back.  No erythema or ecchymosis.  Nontender.  No fluctuation.  Feels like a lipoma.  Neurological:     General: No focal deficit present.     Mental Status: He is alert and oriented to person, place, and time.  Psychiatric:        Mood and Affect: Mood normal.        Behavior: Behavior normal.      ASSESSMENT & PLAN: Johnny Ellison was seen today for establish care.  Diagnoses and all orders for this visit:  Routine general medical examination at a health care facility  Need for diphtheria-tetanus-pertussis (Tdap) vaccine -     TDAP VACCINE  Screening for HIV (human immunodeficiency virus) -     HIV antibody  Screening for deficiency anemia -     CBC with Differential  Screening for lipoid disorders -     Lipid panel  Screening for endocrine, metabolic and immunity disorder -     Comprehensive metabolic panel -     Hemoglobin A1c  Paraspinal soft tissue mass -     [place] CHEST SOFT TISSUE;  Future    Patient Instructions       If you have lab work done today you will be contacted with your lab results within the next 2 weeks.  If you have not heard from [place] then please contact us. The fastest way to get your results is to register for My Chart.   IF you received an x-ray today, you  will receive an invoice from [facility]. Please contact [facility] at [phone] with questions or concerns regarding your invoice.   IF you received labwork today, you will receive an invoice from [facility]. Please contact [facility] at [phone] with questions or concerns regarding your invoice.   Our billing staff will not be able to assist you with questions regarding bills from these companies.  You will be contacted with the lab results as soon as they are available. The fastest way to get your results is to activate your My Chart account. Instructions are located on the last page of this paperwork. If you have not heard from [place] regarding the results in 2 weeks, please contact this office.      Health Maintenance, Male Adopting a healthy lifestyle and getting preventive care are important in promoting health and wellness. Ask your health care provider about:  The right schedule for you to have regular tests and exams.  Things you can do on your own to prevent diseases and keep yourself healthy. What should I know about diet, weight, and exercise? Eat a healthy diet   Eat a diet that includes plenty of vegetables, fruits, low-fat dairy products, and lean protein.  Do not eat a lot of foods that are high in solid fats, added sugars, or sodium. Maintain a healthy weight Body mass index (BMI) is a measurement that can be used to identify possible weight problems. It estimates body fat based on height and weight. Your health care provider can help determine your BMI and help you achieve or maintain a healthy weight. Get regular exercise Get regular exercise. This  is one of the most important things you can do for your health. Most adults should:  Exercise for at least 150 minutes each week. The exercise should increase your heart rate and make you sweat (moderate-intensity exercise).  Do strengthening exercises at least twice a week. This is in addition to the moderate-intensity exercise.  Spend less time sitting. Even light physical activity can be beneficial. Watch cholesterol and blood lipids Have your blood tested for lipids and cholesterol at 36 years of age, then have this test every 5 years. You may need to have your cholesterol levels checked more often if:  Your lipid or cholesterol levels are high.  You are older than 36 years of age.  You are at high risk for heart disease. What should I know about cancer screening? Many types of cancers can be detected early and may often be prevented. Depending on your health history and family history, you may need to have cancer screening at various ages. This may include screening for:  Colorectal cancer.  Prostate cancer.  Skin cancer.  Lung cancer. What should I know about heart disease, diabetes, and high blood pressure? Blood pressure and heart disease  High blood pressure causes heart disease and increases the risk of stroke. This is more likely to develop in people who have high blood pressure readings, are of African descent, or are overweight.  Talk with your health care provider about your target blood pressure readings.  Have your blood pressure checked: ? Every 3-5 years if you are 15-62 years of age. ? Every year if you are 94 years old or older.  If you are between the ages of 10 and 7 and are a current or former smoker, ask your health care provider if you should have a one-time screening for abdominal aortic aneurysm (AAA). Diabetes Have regular diabetes screenings. This checks your  fasting blood sugar level. Have the screening done:  Once every three years after age 36  if you are at a normal weight and have a low risk for diabetes.  More often and at a younger age if you are overweight or have a high risk for diabetes. What should I know about preventing infection? Hepatitis B If you have a higher risk for hepatitis B, you should be screened for this virus. Talk with your health care provider to find out if you are at risk for hepatitis B infection. Hepatitis C Blood testing is recommended for:  Everyone born from [birth year] through 1965.  Anyone with known risk factors for hepatitis C. Sexually transmitted infections (STIs)  You should be screened each year for STIs, including gonorrhea and chlamydia, if: ? You are sexually active and are younger than 36 years of age. ? You are older than 36 years of age and your health care provider tells you that you are at risk for this type of infection. ? Your sexual activity has changed since you were last screened, and you are at increased risk for chlamydia or gonorrhea. Ask your health care provider if you are at risk.  Ask your health care provider about whether you are at high risk for HIV. Your health care provider may recommend a prescription medicine to help prevent HIV infection. If you choose to take medicine to prevent HIV, you should first get tested for HIV. You should then be tested every 3 months for as long as you are taking the medicine. Follow these instructions at home: Lifestyle  Do not use any products that contain nicotine or tobacco, such as cigarettes, e-cigarettes, and chewing tobacco. If you need help quitting, ask your health care provider.  Do not use street drugs.  Do not share needles.  Ask your health care provider for help if you need support or information about quitting drugs. Alcohol use  Do not drink alcohol if your health care provider tells you not to drink.  If you drink alcohol: ? Limit how much you have to 0-2 drinks a day. ? Be aware of how much alcohol is in your  drink. In the [place] one drink equals one 12 oz bottle of beer (355 mL), one 5 oz glass of wine (148 mL), or one 1 oz glass of hard liquor (44 mL). General instructions  Schedule regular health, dental, and eye exams.  Stay current with your vaccines.  Tell your health care provider if: ? You often feel depressed. ? You have ever been abused or do not feel safe at home. Summary  Adopting a healthy lifestyle and getting preventive care are important in promoting health and wellness.  Follow your health care provider's instructions about healthy diet, exercising, and getting tested or screened for diseases.  Follow your health care provider's instructions on monitoring your cholesterol and blood pressure. This information is not intended to replace advice given to you by your health care provider. Make sure you discuss any questions you have with your health care provider. Document Released: 06/21/2007 Document Revised: 12/16/2017 Document Reviewed: 12/16/2017 Elsevier Patient Education  2020 Elsevier Inc.      Edwina BarthMiguel Cherly Erno, MD [facility]

## 2018-12-22 NOTE — Patient Instructions (Addendum)
   If you have lab work done today you will be contacted with your lab results within the next 2 weeks.  If you have not heard from us then please contact us. The fastest way to get your results is to register for My Chart.   IF you received an x-ray today, you will receive an invoice from [facility]. Please contact [facility] at [phone] with questions or concerns regarding your invoice.   IF you received labwork today, you will receive an invoice from [facility]. Please contact [facility] at [phone] with questions or concerns regarding your invoice.   Our billing staff will not be able to assist you with questions regarding bills from these companies.  You will be contacted with the lab results as soon as they are available. The fastest way to get your results is to activate your My Chart account. Instructions are located on the last page of this paperwork. If you have not heard from us regarding the results in 2 weeks, please contact this office.     Health Maintenance, Male Adopting a healthy lifestyle and getting preventive care are important in promoting health and wellness. Ask your health care provider about:  The right schedule for you to have regular tests and exams.  Things you can do on your own to prevent diseases and keep yourself healthy. What should I know about diet, weight, and exercise? Eat a healthy diet   Eat a diet that includes plenty of vegetables, fruits, low-fat dairy products, and lean protein.  Do not eat a lot of foods that are high in solid fats, added sugars, or sodium. Maintain a healthy weight Body mass index (BMI) is a measurement that can be used to identify possible weight problems. It estimates body fat based on height and weight. Your health care provider can help determine your BMI and help you achieve or maintain a healthy weight. Get regular exercise Get regular exercise. This is one of the most important things you  can do for your health. Most adults should:  Exercise for at least 150 minutes each week. The exercise should increase your heart rate and make you sweat (moderate-intensity exercise).  Do strengthening exercises at least twice a week. This is in addition to the moderate-intensity exercise.  Spend less time sitting. Even light physical activity can be beneficial. Watch cholesterol and blood lipids Have your blood tested for lipids and cholesterol at 36 years of age, then have this test every 5 years. You may need to have your cholesterol levels checked more often if:  Your lipid or cholesterol levels are high.  You are older than 36 years of age.  You are at high risk for heart disease. What should I know about cancer screening? Many types of cancers can be detected early and may often be prevented. Depending on your health history and family history, you may need to have cancer screening at various ages. This may include screening for:  Colorectal cancer.  Prostate cancer.  Skin cancer.  Lung cancer. What should I know about heart disease, diabetes, and high blood pressure? Blood pressure and heart disease  High blood pressure causes heart disease and increases the risk of stroke. This is more likely to develop in people who have high blood pressure readings, are of African descent, or are overweight.  Talk with your health care provider about your target blood pressure readings.  Have your blood pressure checked: ? Every 3-5 years if you are 18-39 years   of age. ? Every year if you are 40 years old or older.  If you are between the ages of 65 and 75 and are a current or former smoker, ask your health care provider if you should have a one-time screening for abdominal aortic aneurysm (AAA). Diabetes Have regular diabetes screenings. This checks your fasting blood sugar level. Have the screening done:  Once every three years after age 45 if you are at a normal weight and have  a low risk for diabetes.  More often and at a younger age if you are overweight or have a high risk for diabetes. What should I know about preventing infection? Hepatitis B If you have a higher risk for hepatitis B, you should be screened for this virus. Talk with your health care provider to find out if you are at risk for hepatitis B infection. Hepatitis C Blood testing is recommended for:  Everyone born from 1945 through 1965.  Anyone with known risk factors for hepatitis C. Sexually transmitted infections (STIs)  You should be screened each year for STIs, including gonorrhea and chlamydia, if: ? You are sexually active and are younger than 36 years of age. ? You are older than 36 years of age and your health care provider tells you that you are at risk for this type of infection. ? Your sexual activity has changed since you were last screened, and you are at increased risk for chlamydia or gonorrhea. Ask your health care provider if you are at risk.  Ask your health care provider about whether you are at high risk for HIV. Your health care provider may recommend a prescription medicine to help prevent HIV infection. If you choose to take medicine to prevent HIV, you should first get tested for HIV. You should then be tested every 3 months for as long as you are taking the medicine. Follow these instructions at home: Lifestyle  Do not use any products that contain nicotine or tobacco, such as cigarettes, e-cigarettes, and chewing tobacco. If you need help quitting, ask your health care provider.  Do not use street drugs.  Do not share needles.  Ask your health care provider for help if you need support or information about quitting drugs. Alcohol use  Do not drink alcohol if your health care provider tells you not to drink.  If you drink alcohol: ? Limit how much you have to 0-2 drinks a day. ? Be aware of how much alcohol is in your drink. In the [place] one drink equals one 12  oz bottle of beer (355 mL), one 5 oz glass of wine (148 mL), or one 1 oz glass of hard liquor (44 mL). General instructions  Schedule regular health, dental, and eye exams.  Stay current with your vaccines.  Tell your health care provider if: ? You often feel depressed. ? You have ever been abused or do not feel safe at home. Summary  Adopting a healthy lifestyle and getting preventive care are important in promoting health and wellness.  Follow your health care provider's instructions about healthy diet, exercising, and getting tested or screened for diseases.  Follow your health care provider's instructions on monitoring your cholesterol and blood pressure. This information is not intended to replace advice given to you by your health care provider. Make sure you discuss any questions you have with your health care provider. Document Released: 06/21/2007 Document Revised: 12/16/2017 Document Reviewed: 12/16/2017 Elsevier Patient Education  2020 Elsevier Inc.  

## 2018-12-23 ENCOUNTER — Encounter: Payer: Self-pay | Admitting: Emergency Medicine

## 2018-12-23 LAB — COMPREHENSIVE METABOLIC PANEL
ALT: 20 IU/L (ref 0–44)
AST: 26 IU/L (ref 0–40)
Albumin/Globulin Ratio: 2 (ref 1.2–2.2)
Albumin: 4.5 g/dL (ref 4.0–5.0)
Alkaline Phosphatase: 72 IU/L (ref 39–117)
BUN/Creatinine Ratio: 19 (ref 9–20)
BUN: 17 mg/dL (ref 6–20)
Bilirubin Total: 0.4 mg/dL (ref 0.0–1.2)
CO2: 25 mmol/L (ref 20–29)
Calcium: 9.4 mg/dL (ref 8.7–10.2)
Chloride: 106 mmol/L (ref 96–106)
Creatinine, Ser: 0.88 mg/dL (ref 0.76–1.27)
GFR calc Af Amer: 128 mL/min/{1.73_m2} (ref >59)
GFR calc non Af Amer: 111 mL/min/{1.73_m2} (ref >59)
Globulin, Total: 2.2 g/dL (ref 1.5–4.5)
Glucose: 86 mg/dL (ref 65–99)
Potassium: 4.3 mmol/L (ref 3.5–5.2)
Sodium: 144 mmol/L (ref 134–144)
Total Protein: 6.7 g/dL (ref 6.0–8.5)

## 2018-12-23 LAB — CBC WITH DIFFERENTIAL/PLATELET
Basophils Absolute: 0.1 10*3/uL (ref 0.0–0.2)
Basos: 1 %
EOS (ABSOLUTE): 0.5 10*3/uL — ABNORMAL HIGH (ref 0.0–0.4)
Eos: 9 %
Hematocrit: 47.3 % (ref 37.5–51.0)
Hemoglobin: 16.1 g/dL (ref 13.0–17.7)
Immature Grans (Abs): 0 10*3/uL (ref 0.0–0.1)
Immature Granulocytes: 0 %
Lymphocytes Absolute: 2.2 10*3/uL (ref 0.7–3.1)
Lymphs: 40 %
MCH: 31.6 pg (ref 26.6–33.0)
MCHC: 34 g/dL (ref 31.5–35.7)
MCV: 93 fL (ref 79–97)
Monocytes Absolute: 0.6 10*3/uL (ref 0.1–0.9)
Monocytes: 11 %
Neutrophils Absolute: 2.2 10*3/uL (ref 1.4–7.0)
Neutrophils: 39 %
Platelets: 219 10*3/uL (ref 150–450)
RBC: 5.1 x10E6/uL (ref 4.14–5.80)
RDW: 12.3 % (ref 11.6–15.4)
WBC: 5.6 10*3/uL (ref 3.4–10.8)

## 2018-12-23 LAB — HEMOGLOBIN A1C
Est. average glucose Bld gHb Est-mCnc: 103 mg/dL
Hgb A1c MFr Bld: 5.2 % (ref 4.8–5.6)

## 2018-12-23 LAB — LIPID PANEL
Chol/HDL Ratio: 3.1 ratio (ref 0.0–5.0)
Cholesterol, Total: 116 mg/dL (ref 100–199)
HDL: 37 mg/dL — ABNORMAL LOW (ref >39)
LDL Chol Calc (NIH): 61 mg/dL (ref 0–99)
Triglycerides: 93 mg/dL (ref 0–149)
VLDL Cholesterol Cal: 18 mg/dL (ref 5–40)

## 2018-12-23 LAB — HIV ANTIBODY (ROUTINE TESTING W REFLEX): HIV Screen 4th Generation wRfx: NONREACTIVE

## 2019-04-14 IMAGING — DX DG CHEST 2V
3 series · 3 of 3 positions shown · non-contrast
Comparison: None.

CLINICAL DATA: Persistent cough and wheezing

EXAM:
CHEST  2 VIEW

[chest pa (1 of 2)]
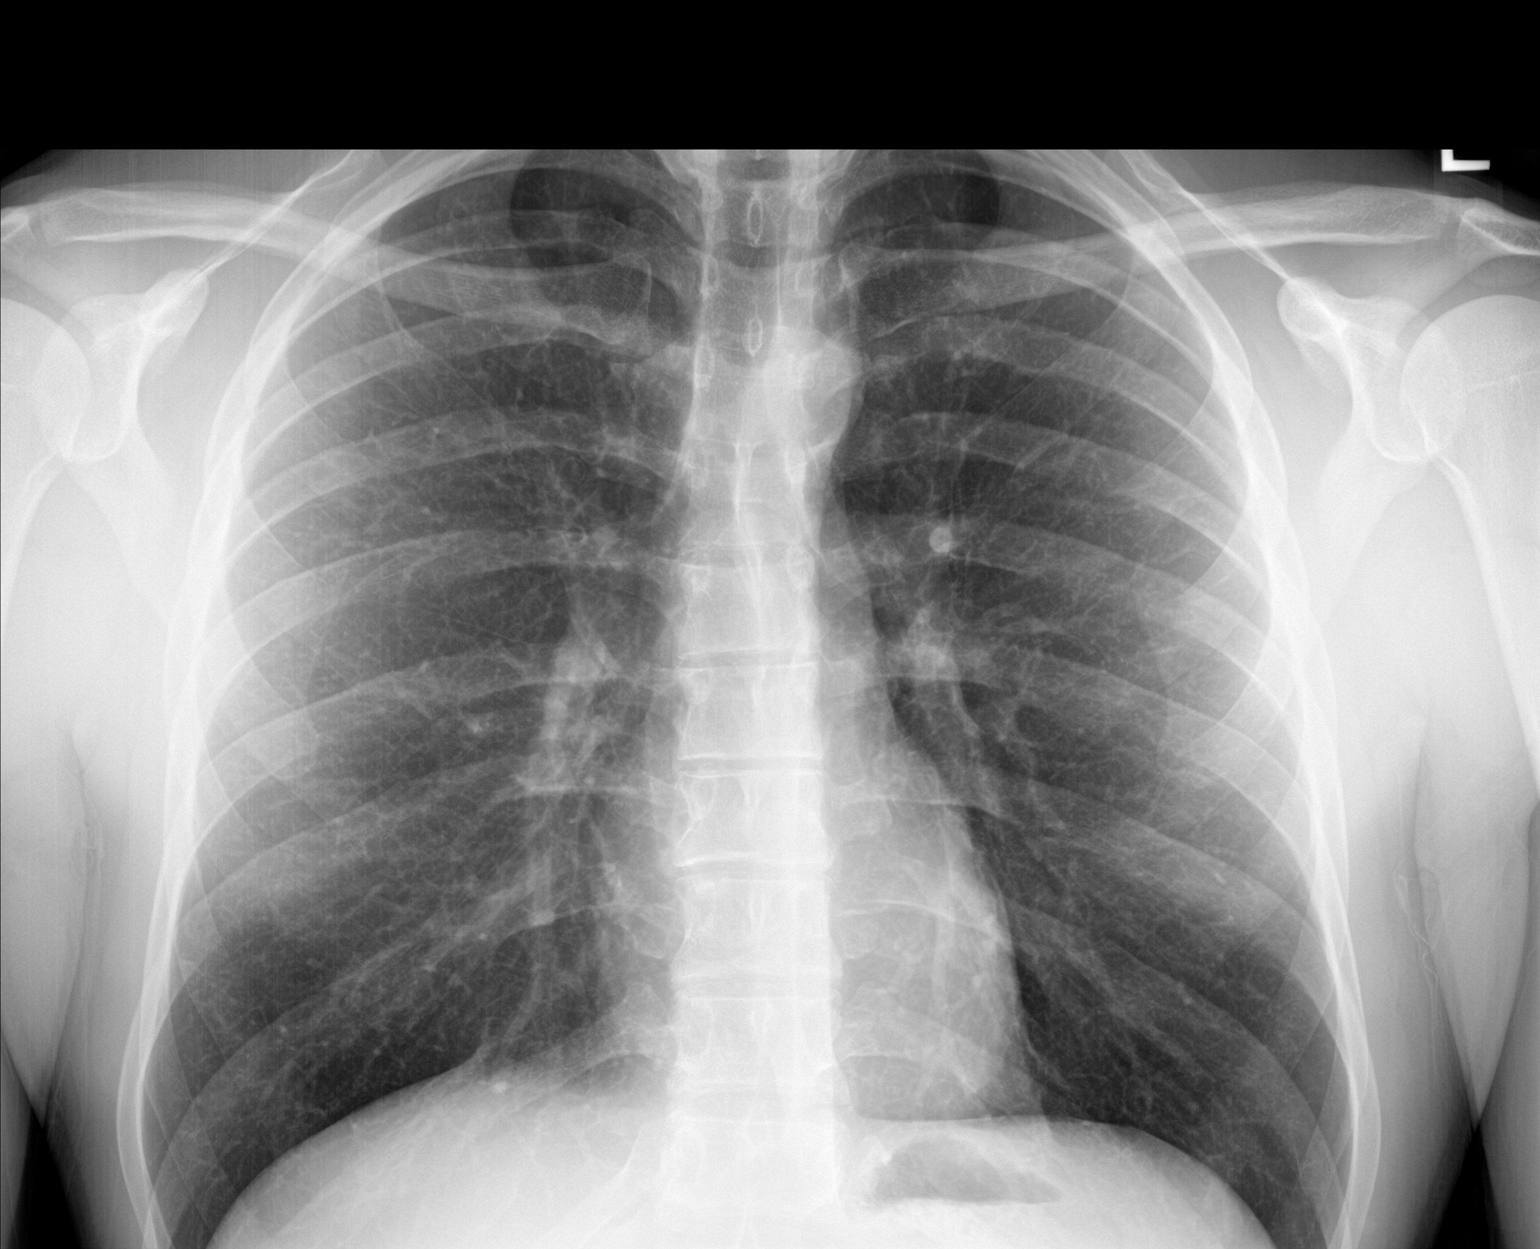

[chest lat]
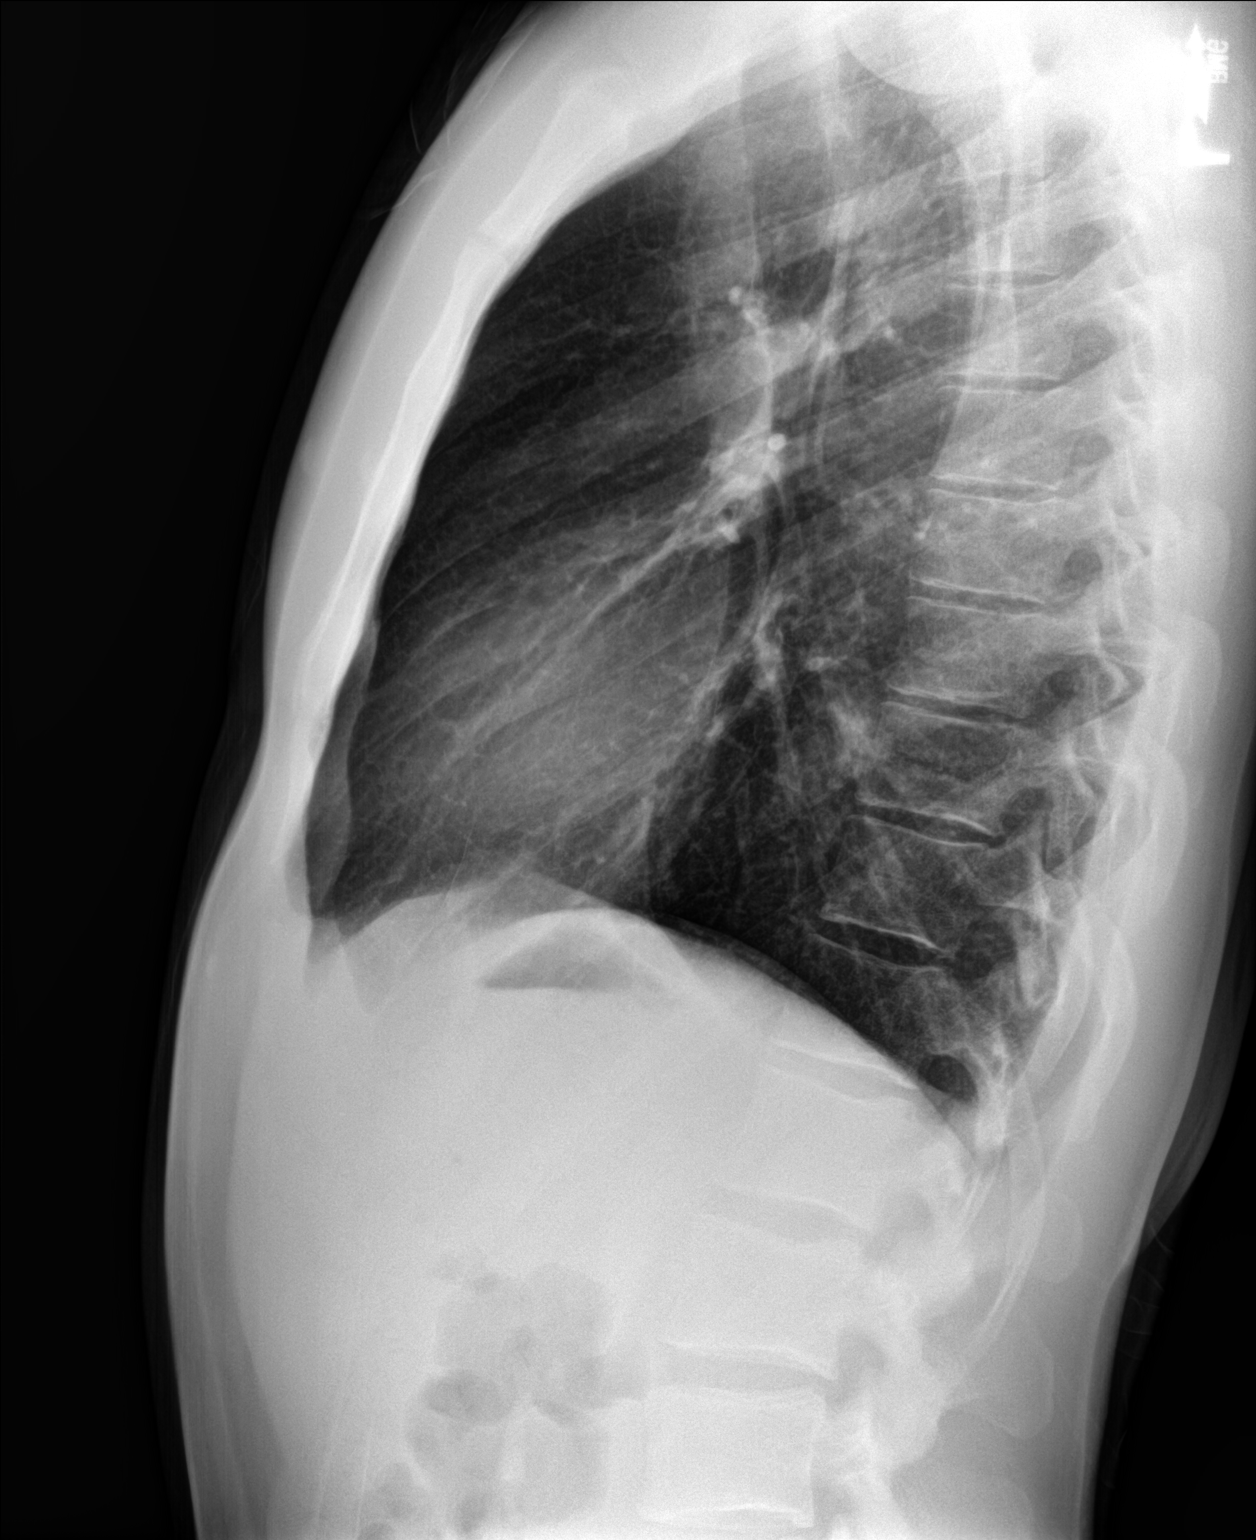

[chest pa (2 of 2)]
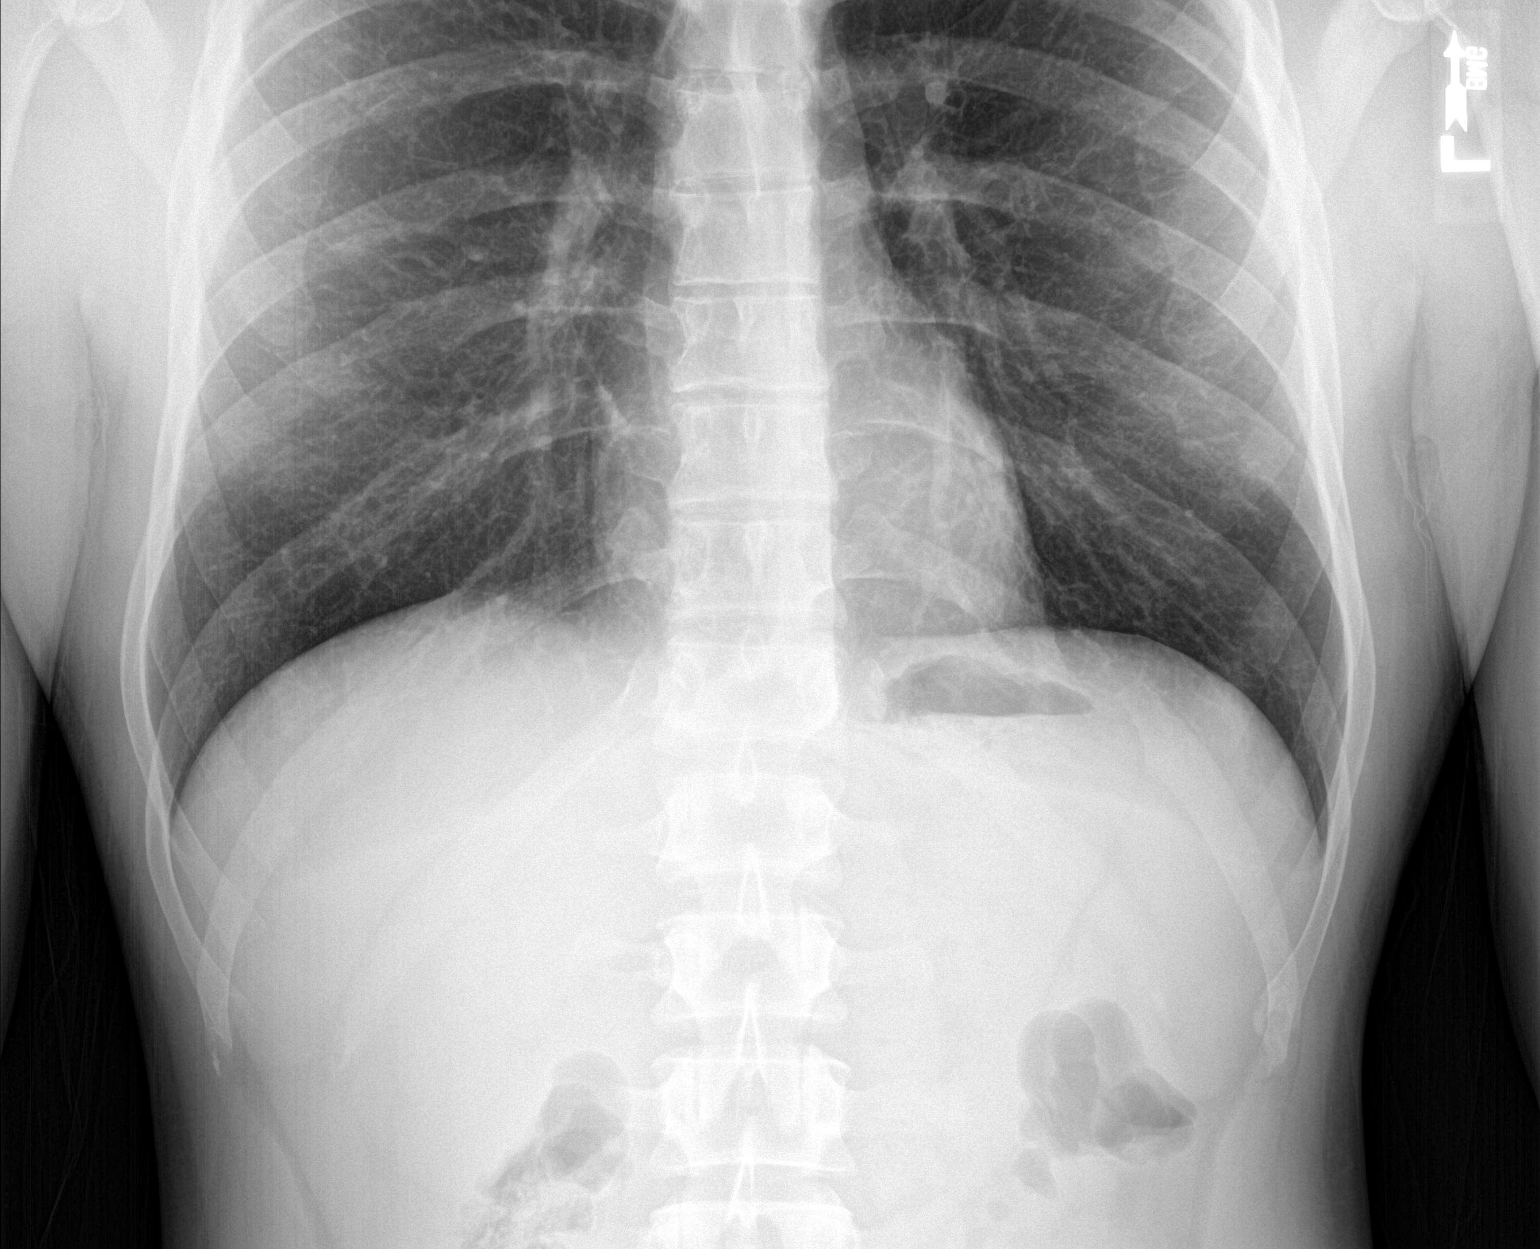

[3 of 3 positions shown; findings below may reference images not displayed]

FINDINGS: The heart size and mediastinal contours are within normal limits.
Both lungs are clear. The visualized skeletal structures are
unremarkable.
IMPRESSION: No active cardiopulmonary disease.

## 2019-12-26 ENCOUNTER — Encounter: Payer: Self-pay | Admitting: Emergency Medicine

## 2019-12-26 ENCOUNTER — Other Ambulatory Visit: Payer: Self-pay

## 2019-12-26 ENCOUNTER — Ambulatory Visit (INDEPENDENT_AMBULATORY_CARE_PROVIDER_SITE_OTHER): Payer: BC Managed Care – PPO | Admitting: Emergency Medicine

## 2019-12-26 VITALS — BP 116/76 | HR 54 | Temp 98.1°F | Resp 16 | Ht 74.0 in | Wt 177.0 lb

## 2019-12-26 DIAGNOSIS — Z13220 Encounter for screening for lipoid disorders: Secondary | ICD-10-CM

## 2019-12-26 DIAGNOSIS — Z130 Encounter for screening for diseases of the blood and blood-forming organs and certain disorders involving the immune mechanism: Secondary | ICD-10-CM

## 2019-12-26 DIAGNOSIS — Z0000 Encounter for general adult medical examination without abnormal findings: Secondary | ICD-10-CM

## 2019-12-26 DIAGNOSIS — Z13228 Encounter for screening for other metabolic disorders: Secondary | ICD-10-CM

## 2019-12-26 DIAGNOSIS — Z1329 Encounter for screening for other suspected endocrine disorder: Secondary | ICD-10-CM | POA: Diagnosis not present

## 2019-12-26 NOTE — Patient Instructions (Addendum)
   If you have lab work done today you will be contacted with your lab results within the next 2 weeks.  If you have not heard from us then please contact us. The fastest way to get your results is to register for My Chart.   IF you received an x-ray today, you will receive an invoice from [facility]. Please contact [facility] at [phone] with questions or concerns regarding your invoice.   IF you received labwork today, you will receive an invoice from [facility]. Please contact [facility] at [phone] with questions or concerns regarding your invoice.   Our billing staff will not be able to assist you with questions regarding bills from these companies.  You will be contacted with the lab results as soon as they are available. The fastest way to get your results is to activate your My Chart account. Instructions are located on the last page of this paperwork. If you have not heard from us regarding the results in 2 weeks, please contact this office.      Health Maintenance, Male Adopting a healthy lifestyle and getting preventive care are important in promoting health and wellness. Ask your health care provider about:  The right schedule for you to have regular tests and exams.  Things you can do on your own to prevent diseases and keep yourself healthy. What should I know about diet, weight, and exercise? Eat a healthy diet   Eat a diet that includes plenty of vegetables, fruits, low-fat dairy products, and lean protein.  Do not eat a lot of foods that are high in solid fats, added sugars, or sodium. Maintain a healthy weight Body mass index (BMI) is a measurement that can be used to identify possible weight problems. It estimates body fat based on height and weight. Your health care provider can help determine your BMI and help you achieve or maintain a healthy weight. Get regular exercise Get regular exercise. This is one of the most important things you  can do for your health. Most adults should:  Exercise for at least 150 minutes each week. The exercise should increase your heart rate and make you sweat (moderate-intensity exercise).  Do strengthening exercises at least twice a week. This is in addition to the moderate-intensity exercise.  Spend less time sitting. Even light physical activity can be beneficial. Watch cholesterol and blood lipids Have your blood tested for lipids and cholesterol at 37 years of age, then have this test every 5 years. You may need to have your cholesterol levels checked more often if:  Your lipid or cholesterol levels are high.  You are older than 37 years of age.  You are at high risk for heart disease. What should I know about cancer screening? Many types of cancers can be detected early and may often be prevented. Depending on your health history and family history, you may need to have cancer screening at various ages. This may include screening for:  Colorectal cancer.  Prostate cancer.  Skin cancer.  Lung cancer. What should I know about heart disease, diabetes, and high blood pressure? Blood pressure and heart disease  High blood pressure causes heart disease and increases the risk of stroke. This is more likely to develop in people who have high blood pressure readings, are of African descent, or are overweight.  Talk with your health care provider about your target blood pressure readings.  Have your blood pressure checked: ? Every 3-5 years if you are 18-39   years of age. ? Every year if you are 40 years old or older.  If you are between the ages of 65 and 75 and are a current or former smoker, ask your health care provider if you should have a one-time screening for abdominal aortic aneurysm (AAA). Diabetes Have regular diabetes screenings. This checks your fasting blood sugar level. Have the screening done:  Once every three years after age 45 if you are at a normal weight and have  a low risk for diabetes.  More often and at a younger age if you are overweight or have a high risk for diabetes. What should I know about preventing infection? Hepatitis B If you have a higher risk for hepatitis B, you should be screened for this virus. Talk with your health care provider to find out if you are at risk for hepatitis B infection. Hepatitis C Blood testing is recommended for:  Everyone born from 1945 through 1965.  Anyone with known risk factors for hepatitis C. Sexually transmitted infections (STIs)  You should be screened each year for STIs, including gonorrhea and chlamydia, if: ? You are sexually active and are younger than 37 years of age. ? You are older than 37 years of age and your health care provider tells you that you are at risk for this type of infection. ? Your sexual activity has changed since you were last screened, and you are at increased risk for chlamydia or gonorrhea. Ask your health care provider if you are at risk.  Ask your health care provider about whether you are at high risk for HIV. Your health care provider may recommend a prescription medicine to help prevent HIV infection. If you choose to take medicine to prevent HIV, you should first get tested for HIV. You should then be tested every 3 months for as long as you are taking the medicine. Follow these instructions at home: Lifestyle  Do not use any products that contain nicotine or tobacco, such as cigarettes, e-cigarettes, and chewing tobacco. If you need help quitting, ask your health care provider.  Do not use street drugs.  Do not share needles.  Ask your health care provider for help if you need support or information about quitting drugs. Alcohol use  Do not drink alcohol if your health care provider tells you not to drink.  If you drink alcohol: ? Limit how much you have to 0-2 drinks a day. ? Be aware of how much alcohol is in your drink. In the [place] one drink equals one 12  oz bottle of beer (355 mL), one 5 oz glass of wine (148 mL), or one 1 oz glass of hard liquor (44 mL). General instructions  Schedule regular health, dental, and eye exams.  Stay current with your vaccines.  Tell your health care provider if: ? You often feel depressed. ? You have ever been abused or do not feel safe at home. Summary  Adopting a healthy lifestyle and getting preventive care are important in promoting health and wellness.  Follow your health care provider's instructions about healthy diet, exercising, and getting tested or screened for diseases.  Follow your health care provider's instructions on monitoring your cholesterol and blood pressure. This information is not intended to replace advice given to you by your health care provider. Make sure you discuss any questions you have with your health care provider. Document Revised: 12/16/2017 Document Reviewed: 12/16/2017 Elsevier Patient Education  2020 Elsevier Inc.  

## 2019-12-26 NOTE — Progress Notes (Signed)
Johnny Ellison 39 y.o.   Chief Complaint  Patient presents with  . Annual Exam  . Immunizations    Flu vaccine    HISTORY OF PRESENT ILLNESS: This is a 39 y.o. male here for annual exam. No chronic medical problems.  No chronic medications. Healthy male with a healthy lifestyle. Fully vaccinated against Covid.  HPI   Prior to Admission medications   Medication Sig Start Date End Date Taking? Authorizing Provider  albuterol (PROVENTIL HFA;VENTOLIN HFA) 108 (90 Base) MCG/ACT inhaler Inhale 2 puffs every 6 (six) hours as needed into the lungs for wheezing or shortness of breath. Patient not taking: Reported on 12/22/2018 11/18/16   Johnny Quint, MD  OVER THE COUNTER MEDICATION daily.    [provider]    No Known Allergies  There are no problems to display for this patient.   History reviewed. No pertinent past medical history.  History reviewed. No pertinent surgical history.  Social History   Socioeconomic History  . Marital status: Single    Spouse name: Not on file  . Number of children: Not on file  . Years of education: Not on file  . Highest education level: Not on file  Occupational History  . Not on file  Tobacco Use  . Smoking status: Never Smoker  . Smokeless tobacco: Never Used  Substance and Sexual Activity  . Alcohol use: Yes    Alcohol/week: 4.0 standard drinks    Types: 4 Standard drinks or equivalent per week  . Drug use: No  . Sexual activity: Not on file  Other Topics Concern  . Not on file  Social History Narrative  . Not on file   Social Determinants of Health   Financial Resource Strain: Not on file  Food Insecurity: Not on file  Transportation Needs: Not on file  Physical Activity: Not on file  Stress: Not on file  Social Connections: Not on file  Intimate Partner Violence: Not on file    History reviewed. No pertinent family history.   Review of Systems  Constitutional: Negative.  Negative for chills  and fever.  HENT: Positive for congestion. Negative for sore throat.   Respiratory: Positive for cough. Negative for shortness of breath.   Cardiovascular: Negative.  Negative for chest pain and palpitations.  Gastrointestinal: Negative.  Negative for abdominal pain, diarrhea, nausea and vomiting.  Genitourinary: Negative.  Negative for dysuria and hematuria.  Musculoskeletal: Negative.  Negative for back pain, myalgias and neck pain.  Skin: Negative.  Negative for rash.  Neurological: Negative.  Negative for dizziness and headaches.  All other systems reviewed and are negative.    Today's Vitals   12/26/19 0836  BP: 116/76  Pulse: (!) 54  Resp: 16  Temp: 98.1 F (36.7 C)  TempSrc: Temporal  SpO2: 98%  Weight: 177 lb (80.3 kg)  Height: 6\' 2"  (1.88 m)   Body mass index is 22.73 kg/m.   Physical Exam Vitals reviewed.  Constitutional:      Appearance: Normal appearance.  HENT:     Head: Normocephalic.  Eyes:     Extraocular Movements: Extraocular movements intact.     Pupils: Pupils are equal, round, and reactive to light.  Cardiovascular:     Rate and Rhythm: Normal rate and regular rhythm.     Pulses: Normal pulses.     Heart sounds: Normal heart sounds.  Pulmonary:     Effort: Pulmonary effort is normal.     Breath sounds: Normal breath sounds.  Abdominal:     General: Bowel sounds are normal. There is no distension.     Palpations: Abdomen is soft.     Tenderness: There is no abdominal tenderness.  Musculoskeletal:        General: Normal range of motion.     Cervical back: Normal range of motion and neck supple.  Skin:    General: Skin is warm and dry.     Capillary Refill: Capillary refill takes less than 2 seconds.  Neurological:     General: No focal deficit present.     Mental Status: He is alert and oriented to person, place, and time.  Psychiatric:        Mood and Affect: Mood normal.        Behavior: Behavior normal.      ASSESSMENT &  PLAN: [name] was seen today for annual exam and immunizations.  Diagnoses and all orders for this visit:  Routine general medical examination at a health care facility  Screening for deficiency anemia -     CBC with Differential/Platelet  Screening for lipoid disorders -     Lipid panel  Screening for endocrine, metabolic and immunity disorder -     Comprehensive metabolic panel    Patient Instructions       If you have lab work done today you will be contacted with your lab results within the next 2 weeks.  If you have not heard from [place] then please contact us. The fastest way to get your results is to register for My Chart.   IF you received an x-ray today, you will receive an invoice from [facility]. Please contact [facility] at [phone] with questions or concerns regarding your invoice.   IF you received labwork today, you will receive an invoice from [facility]. Please contact [facility] at [phone] with questions or concerns regarding your invoice.   Our billing staff will not be able to assist you with questions regarding bills from these companies.  You will be contacted with the lab results as soon as they are available. The fastest way to get your results is to activate your My Chart account. Instructions are located on the last page of this paperwork. If you have not heard from [place] regarding the results in 2 weeks, please contact this office.      Health Maintenance, Male Adopting a healthy lifestyle and getting preventive care are important in promoting health and wellness. Ask your health care provider about:  The right schedule for you to have regular tests and exams.  Things you can do on your own to prevent diseases and keep yourself healthy. What should I know about diet, weight, and exercise? Eat a healthy diet   Eat a diet that includes plenty of vegetables, fruits, low-fat dairy products, and lean protein.  Do not eat a lot of  foods that are high in solid fats, added sugars, or sodium. Maintain a healthy weight Body mass index (BMI) is a measurement that can be used to identify possible weight problems. It estimates body fat based on height and weight. Your health care provider can help determine your BMI and help you achieve or maintain a healthy weight. Get regular exercise Get regular exercise. This is one of the most important things you can do for your health. Most adults should:  Exercise for at least 150 minutes each week. The exercise should increase your heart rate and make you sweat (moderate-intensity exercise).  Do strengthening exercises at least  twice a week. This is in addition to the moderate-intensity exercise.  Spend less time sitting. Even light physical activity can be beneficial. Watch cholesterol and blood lipids Have your blood tested for lipids and cholesterol at 37 years of age, then have this test every 5 years. You may need to have your cholesterol levels checked more often if:  Your lipid or cholesterol levels are high.  You are older than 37 years of age.  You are at high risk for heart disease. What should I know about cancer screening? Many types of cancers can be detected early and may often be prevented. Depending on your health history and family history, you may need to have cancer screening at various ages. This may include screening for:  Colorectal cancer.  Prostate cancer.  Skin cancer.  Lung cancer. What should I know about heart disease, diabetes, and high blood pressure? Blood pressure and heart disease  High blood pressure causes heart disease and increases the risk of stroke. This is more likely to develop in people who have high blood pressure readings, are of African descent, or are overweight.  Talk with your health care provider about your target blood pressure readings.  Have your blood pressure checked: ? Every 3-5 years if you are 18-39 years of  age. ? Every year if you are 26 years old or older.  If you are between the ages of 29 and 38 and are a current or former smoker, ask your health care provider if you should have a one-time screening for abdominal aortic aneurysm (AAA). Diabetes Have regular diabetes screenings. This checks your fasting blood sugar level. Have the screening done:  Once every three years after age 75 if you are at a normal weight and have a low risk for diabetes.  More often and at a younger age if you are overweight or have a high risk for diabetes. What should I know about preventing infection? Hepatitis B If you have a higher risk for hepatitis B, you should be screened for this virus. Talk with your health care provider to find out if you are at risk for hepatitis B infection. Hepatitis C Blood testing is recommended for:  Everyone born from [birth year] through 1965.  Anyone with known risk factors for hepatitis C. Sexually transmitted infections (STIs)  You should be screened each year for STIs, including gonorrhea and chlamydia, if: ? You are sexually active and are younger than 37 years of age. ? You are older than 37 years of age and your health care provider tells you that you are at risk for this type of infection. ? Your sexual activity has changed since you were last screened, and you are at increased risk for chlamydia or gonorrhea. Ask your health care provider if you are at risk.  Ask your health care provider about whether you are at high risk for HIV. Your health care provider may recommend a prescription medicine to help prevent HIV infection. If you choose to take medicine to prevent HIV, you should first get tested for HIV. You should then be tested every 3 months for as long as you are taking the medicine. Follow these instructions at home: Lifestyle  Do not use any products that contain nicotine or tobacco, such as cigarettes, e-cigarettes, and chewing tobacco. If you need help quitting,  ask your health care provider.  Do not use street drugs.  Do not share needles.  Ask your health care provider for help if you need support  or information about quitting drugs. Alcohol use  Do not drink alcohol if your health care provider tells you not to drink.  If you drink alcohol: ? Limit how much you have to 0-2 drinks a day. ? Be aware of how much alcohol is in your drink. In the [place] one drink equals one 12 oz bottle of beer (355 mL), one 5 oz glass of wine (148 mL), or one 1 oz glass of hard liquor (44 mL). General instructions  Schedule regular health, dental, and eye exams.  Stay current with your vaccines.  Tell your health care provider if: ? You often feel depressed. ? You have ever been abused or do not feel safe at home. Summary  Adopting a healthy lifestyle and getting preventive care are important in promoting health and wellness.  Follow your health care provider's instructions about healthy diet, exercising, and getting tested or screened for diseases.  Follow your health care provider's instructions on monitoring your cholesterol and blood pressure. This information is not intended to replace advice given to you by your health care provider. Make sure you discuss any questions you have with your health care provider. Document Revised: 12/16/2017 Document Reviewed: 12/16/2017 Elsevier Patient Education  2020 Elsevier Inc.      Edwina Barth, MD [facility]

## 2019-12-27 LAB — COMPREHENSIVE METABOLIC PANEL
ALT: 21 IU/L (ref 0–44)
AST: 24 IU/L (ref 0–40)
Albumin/Globulin Ratio: 1.6 (ref 1.2–2.2)
Albumin: 4.5 g/dL (ref 4.0–5.0)
Alkaline Phosphatase: 77 IU/L (ref 44–121)
BUN/Creatinine Ratio: 14 (ref 9–20)
BUN: 13 mg/dL (ref 6–20)
Bilirubin Total: 0.5 mg/dL (ref 0.0–1.2)
CO2: 28 mmol/L (ref 20–29)
Calcium: 9.5 mg/dL (ref 8.7–10.2)
Chloride: 98 mmol/L (ref 96–106)
Creatinine, Ser: 0.95 mg/dL (ref 0.76–1.27)
GFR calc Af Amer: 118 mL/min/{1.73_m2} (ref >59)
GFR calc non Af Amer: 102 mL/min/{1.73_m2} (ref >59)
Globulin, Total: 2.8 g/dL (ref 1.5–4.5)
Glucose: 91 mg/dL (ref 65–99)
Potassium: 4.8 mmol/L (ref 3.5–5.2)
Sodium: 138 mmol/L (ref 134–144)
Total Protein: 7.3 g/dL (ref 6.0–8.5)

## 2019-12-27 LAB — CBC WITH DIFFERENTIAL/PLATELET
Basophils Absolute: 0.1 10*3/uL (ref 0.0–0.2)
Basos: 1 %
EOS (ABSOLUTE): 0.5 10*3/uL — ABNORMAL HIGH (ref 0.0–0.4)
Eos: 7 %
Hematocrit: 47.5 % (ref 37.5–51.0)
Hemoglobin: 16.3 g/dL (ref 13.0–17.7)
Immature Grans (Abs): 0 10*3/uL (ref 0.0–0.1)
Immature Granulocytes: 0 %
Lymphocytes Absolute: 2.5 10*3/uL (ref 0.7–3.1)
Lymphs: 35 %
MCH: 31.6 pg (ref 26.6–33.0)
MCHC: 34.3 g/dL (ref 31.5–35.7)
MCV: 92 fL (ref 79–97)
Monocytes Absolute: 0.8 10*3/uL (ref 0.1–0.9)
Monocytes: 11 %
Neutrophils Absolute: 3.2 10*3/uL (ref 1.4–7.0)
Neutrophils: 46 %
Platelets: 254 10*3/uL (ref 150–450)
RBC: 5.16 x10E6/uL (ref 4.14–5.80)
RDW: 12.1 % (ref 11.6–15.4)
WBC: 7.1 10*3/uL (ref 3.4–10.8)

## 2019-12-27 LAB — LIPID PANEL
Chol/HDL Ratio: 3.5 ratio (ref 0.0–5.0)
Cholesterol, Total: 134 mg/dL (ref 100–199)
HDL: 38 mg/dL — ABNORMAL LOW (ref >39)
LDL Chol Calc (NIH): 79 mg/dL (ref 0–99)
Triglycerides: 87 mg/dL (ref 0–149)
VLDL Cholesterol Cal: 17 mg/dL (ref 5–40)

## 2020-05-02 ENCOUNTER — Telehealth: Payer: Self-pay | Admitting: Emergency Medicine

## 2020-05-02 NOTE — Telephone Encounter (Signed)
Patient's wife Shanda Bumps dropped off a medical evaluation form off for her husband. He recently had a physical and just needs this form to be filled out for his job. I put the form in the doctor's box.   Please call them when completed at [phone]

## 2020-05-10 NOTE — Telephone Encounter (Signed)
I checked clerical spoke to Amy and Dr Latrelle Dodrill box, no form. I will check again.

## 2020-05-17 NOTE — Telephone Encounter (Signed)
Spoke to patient he states the form was picked up someone called from the office it was ready for pick up. Patient will check with his wife.

## 2020-12-27 ENCOUNTER — Encounter: Payer: Self-pay | Admitting: Emergency Medicine

## 2021-01-14 ENCOUNTER — Encounter: Payer: Self-pay | Admitting: Emergency Medicine

## 2021-01-14 ENCOUNTER — Ambulatory Visit (INDEPENDENT_AMBULATORY_CARE_PROVIDER_SITE_OTHER): Payer: BC Managed Care – PPO | Admitting: Emergency Medicine

## 2021-01-14 ENCOUNTER — Other Ambulatory Visit: Payer: Self-pay

## 2021-01-14 VITALS — BP 116/60 | HR 71 | Ht 72.0 in | Wt 172.0 lb

## 2021-01-14 DIAGNOSIS — R002 Palpitations: Secondary | ICD-10-CM

## 2021-01-14 DIAGNOSIS — Z13220 Encounter for screening for lipoid disorders: Secondary | ICD-10-CM | POA: Diagnosis not present

## 2021-01-14 DIAGNOSIS — I4510 Unspecified right bundle-branch block: Secondary | ICD-10-CM

## 2021-01-14 DIAGNOSIS — Z13228 Encounter for screening for other metabolic disorders: Secondary | ICD-10-CM

## 2021-01-14 DIAGNOSIS — Z1329 Encounter for screening for other suspected endocrine disorder: Secondary | ICD-10-CM

## 2021-01-14 DIAGNOSIS — Z0000 Encounter for general adult medical examination without abnormal findings: Secondary | ICD-10-CM

## 2021-01-14 DIAGNOSIS — R001 Bradycardia, unspecified: Secondary | ICD-10-CM | POA: Diagnosis not present

## 2021-01-14 DIAGNOSIS — Z130 Encounter for screening for diseases of the blood and blood-forming organs and certain disorders involving the immune mechanism: Secondary | ICD-10-CM | POA: Diagnosis not present

## 2021-01-14 DIAGNOSIS — Z1159 Encounter for screening for other viral diseases: Secondary | ICD-10-CM

## 2021-01-14 LAB — CBC WITH DIFFERENTIAL/PLATELET
Basophils Absolute: 0.1 10*3/uL (ref 0.0–0.1)
Basophils Relative: 1.3 % (ref 0.0–3.0)
Eosinophils Absolute: 0.4 10*3/uL (ref 0.0–0.7)
Eosinophils Relative: 6.2 % — ABNORMAL HIGH (ref 0.0–5.0)
HCT: 47.2 % (ref 39.0–52.0)
Hemoglobin: 15.8 g/dL (ref 13.0–17.0)
Lymphocytes Relative: 38.5 % (ref 12.0–46.0)
Lymphs Abs: 2.2 10*3/uL (ref 0.7–4.0)
MCHC: 33.5 g/dL (ref 30.0–36.0)
MCV: 91 fl (ref 78.0–100.0)
Monocytes Absolute: 0.6 10*3/uL (ref 0.1–1.0)
Monocytes Relative: 10.5 % (ref 3.0–12.0)
Neutro Abs: 2.5 10*3/uL (ref 1.4–7.7)
Neutrophils Relative %: 43.5 % (ref 43.0–77.0)
Platelets: 237 10*3/uL (ref 150.0–400.0)
RBC: 5.18 Mil/uL (ref 4.22–5.81)
RDW: 12.9 % (ref 11.5–15.5)
WBC: 5.7 10*3/uL (ref 4.0–10.5)

## 2021-01-14 LAB — COMPREHENSIVE METABOLIC PANEL
ALT: 21 U/L (ref 0–53)
AST: 21 U/L (ref 0–37)
Albumin: 4.6 g/dL (ref 3.5–5.2)
Alkaline Phosphatase: 56 U/L (ref 39–117)
BUN: 18 mg/dL (ref 6–23)
CO2: 31 mEq/L (ref 19–32)
Calcium: 9.7 mg/dL (ref 8.4–10.5)
Chloride: 99 mEq/L (ref 96–112)
Creatinine, Ser: 0.85 mg/dL (ref 0.40–1.50)
GFR: 110.18 mL/min (ref >60.00)
Glucose, Bld: 84 mg/dL (ref 70–99)
Potassium: 4.2 mEq/L (ref 3.5–5.1)
Sodium: 138 mEq/L (ref 135–145)
Total Bilirubin: 0.7 mg/dL (ref 0.2–1.2)
Total Protein: 7.6 g/dL (ref 6.0–8.3)

## 2021-01-14 LAB — LIPID PANEL
Cholesterol: 134 mg/dL (ref 0–200)
HDL: 41.5 mg/dL (ref >39.00)
LDL Cholesterol: 74 mg/dL (ref 0–99)
NonHDL: 92.09
Total CHOL/HDL Ratio: 3
Triglycerides: 89 mg/dL (ref 0.0–149.0)
VLDL: 17.8 mg/dL (ref 0.0–40.0)

## 2021-01-14 LAB — HEMOGLOBIN A1C: Hgb A1c MFr Bld: 5.4 % (ref 4.6–6.5)

## 2021-01-14 NOTE — Assessment & Plan Note (Signed)
Stable.  Had symptomatic episode of palpitations.  Short-lived. 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 Needs cardiology evaluation.  Referral placed today.

## 2021-01-14 NOTE — Progress Notes (Signed)
Johnny Ellison 41 y.o.   Chief Complaint  Patient presents with   Annual Exam    Heart flutter, this weekend pt states lasted longer that usual    HISTORY OF PRESENT ILLNESS: This is a 41 y.o. male here for annual exam. Healthy male with a healthy lifestyle. Acute problem: Last Friday had short-lived episode of "heart flutter".  Was walking at the time.  Episode lasted about 20 seconds.  Felt like he could not catch his breath and was slightly lightheaded.  No syncopal episode.  No chest pain. Non-smoker.  No EtOH abuser.  Drank some green tea that morning. High school teacher and basketball coach. No prior episodes.  No history of heart conditions.  No family history of sudden death.  No history of thyroid disorders. Not taking any over-the-counter medications such as antihistamines or decongestants. No other complaints or medical concerns today.  HPI   Prior to Admission medications   Medication Sig Start Date End Date Taking? Authorizing Provider  albuterol (PROVENTIL HFA;VENTOLIN HFA) 108 (90 Base) MCG/ACT inhaler Inhale 2 puffs every 6 (six) hours as needed into the lungs for wheezing or shortness of breath. Patient not taking: Reported on 01/14/2021 11/18/16   Horald Pollen, MD  OVER THE COUNTER MEDICATION daily. Patient not taking: Reported on 01/14/2021    [provider]    No Known Allergies  There are no problems to display for this patient.   History reviewed. No pertinent past medical history.  History reviewed. No pertinent surgical history.  Social History   Socioeconomic History   Marital status: Single    Spouse name: Not on file   Number of children: Not on file   Years of education: Not on file   Highest education level: Not on file  Occupational History   Not on file  Tobacco Use   Smoking status: Never   Smokeless tobacco: Never  Substance and Sexual Activity   Alcohol use: Yes    Alcohol/week: 4.0 standard drinks    Types: 4  Standard drinks or equivalent per week   Drug use: No   Sexual activity: Not on file  Other Topics Concern   Not on file  Social History Narrative   Not on file   Social Determinants of Health   Financial Resource Strain: Not on file  Food Insecurity: Not on file  Transportation Needs: Not on file  Physical Activity: Not on file  Stress: Not on file  Social Connections: Not on file  Intimate Partner Violence: Not on file    History reviewed. No pertinent family history.   Review of Systems  Constitutional: Negative.  Negative for chills, fever, malaise/fatigue and weight loss.  HENT: Negative.  Negative for congestion and sore throat.   Eyes: Negative.   Respiratory: Negative.  Negative for cough and shortness of breath.   Cardiovascular:  Positive for palpitations. Negative for chest pain and leg swelling.  Gastrointestinal: Negative.  Negative for abdominal pain, diarrhea, nausea and vomiting.  Genitourinary: Negative.  Negative for dysuria and hematuria.  Skin: Negative.  Negative for rash.  Neurological: Negative.  Negative for dizziness and headaches.  All other systems reviewed and are negative.  Today's Vitals   01/14/21 0837  BP: 116/60  Pulse: 71  SpO2: 98%  Weight: 172 lb (78 kg)  Height: 6' (1.829 m)   Body mass index is 23.33 kg/m.  Physical Exam Vitals reviewed.  Constitutional:      Appearance: Normal appearance.  HENT:  Head: Normocephalic.     Right Ear: Tympanic membrane, ear canal and external ear normal.     Left Ear: Tympanic membrane, ear canal and external ear normal.     Mouth/Throat:     Mouth: Mucous membranes are moist.     Pharynx: Oropharynx is clear.  Eyes:     Extraocular Movements: Extraocular movements intact.     Conjunctiva/sclera: Conjunctivae normal.     Pupils: Pupils are equal, round, and reactive to light.  Neck:     Vascular: No carotid bruit.  Cardiovascular:     Rate and Rhythm: Normal rate and regular rhythm.      Pulses: Normal pulses.     Heart sounds: Normal heart sounds.  Pulmonary:     Effort: Pulmonary effort is normal.     Breath sounds: Normal breath sounds.  Abdominal:     General: Bowel sounds are normal. There is no distension.     Palpations: Abdomen is soft. There is no mass.     Tenderness: There is no abdominal tenderness.  Musculoskeletal:        General: Normal range of motion.     Cervical back: Normal range of motion and neck supple. No tenderness.  Lymphadenopathy:     Cervical: No cervical adenopathy.  Skin:    General: Skin is warm and dry.     Capillary Refill: Capillary refill takes less than 2 seconds.  Neurological:     General: No focal deficit present.     Mental Status: He is alert and oriented to person, place, and time.  Psychiatric:        Mood and Affect: Mood normal.        Behavior: Behavior normal.    EKG: Sinus bradycardia with ventricular rate of 51/min.  Incomplete right bundle branch block. ASSESSMENT & PLAN: Problem List Items Addressed This Visit       Other   Palpitations    Stable.  Had symptomatic episode of palpitations.  Short-lived. 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 Needs cardiology evaluation.  Referral placed today.      Relevant Orders   EKG 12-Lead   Thyroid Panel With TSH   Ambulatory referral to Cardiology   Other Visit Diagnoses     Routine general medical examination at a health care facility    -  Primary   Need for hepatitis C screening test       Relevant Orders   Hepatitis C antibody screen   Screening for deficiency anemia       Relevant Orders   CBC with Differential (Completed)   Screening for lipoid disorders       Relevant Orders   Lipid panel (Completed)   Screening for endocrine, metabolic and immunity disorder       Relevant Orders   Comprehensive metabolic panel (Completed)   Hemoglobin A1c (Completed)   Thyroid Panel With TSH   Incomplete right bundle branch  block       Relevant Orders   Ambulatory referral to Cardiology   Sinus bradycardia       Relevant Orders   Ambulatory referral to Cardiology     Modifiable risk factors discussed with patient. Anticipatory guidance according to age provided. The following topics were also discussed: Social Determinants of Health Smoking.  Non-smoker Diet and nutrition Benefits of exercise Cancer family history review Vaccinations recommendations Cardiovascular risk assessment Differential diagnosis of palpitations, EKG findings, and need for follow-up with cardiology Mental health  including depression and anxiety Fall and accident prevention  Patient Instructions  Health Maintenance, Male Adopting a healthy lifestyle and getting preventive care are important in promoting health and wellness. Ask your health care provider about: The right schedule for you to have regular tests and exams. Things you can do on your own to prevent diseases and keep yourself healthy. What should I know about diet, weight, and exercise? Eat a healthy diet  Eat a diet that includes plenty of vegetables, fruits, low-fat dairy products, and lean protein. Do not eat a lot of foods that are high in solid fats, added sugars, or sodium. Maintain a healthy weight Body mass index (BMI) is a measurement that can be used to identify possible weight problems. It estimates body fat based on height and weight. Your health care provider can help determine your BMI and help you achieve or maintain a healthy weight. Get regular exercise Get regular exercise. This is one of the most important things you can do for your health. Most adults should: Exercise for at least 150 minutes each week. The exercise should increase your heart rate and make you sweat (moderate-intensity exercise). Do strengthening exercises at least twice a week. This is in addition to the moderate-intensity exercise. Spend less time sitting. Even light physical  activity can be beneficial. Watch cholesterol and blood lipids Have your blood tested for lipids and cholesterol at 39 years of age, then have this test every 5 years. You may need to have your cholesterol levels checked more often if: Your lipid or cholesterol levels are high. You are older than 39 years of age. You are at high risk for heart disease. What should I know about cancer screening? Many types of cancers can be detected early and may often be prevented. Depending on your health history and family history, you may need to have cancer screening at various ages. This may include screening for: Colorectal cancer. Prostate cancer. Skin cancer. Lung cancer. What should I know about heart disease, diabetes, and high blood pressure? Blood pressure and heart disease High blood pressure causes heart disease and increases the risk of stroke. This is more likely to develop in people who have high blood pressure readings or are overweight. Talk with your health care provider about your target blood pressure readings. Have your blood pressure checked: Every 3-5 years if you are 49-38 years of age. Every year if you are 56 years old or older. If you are between the ages of 59 and 43 and are a current or former smoker, ask your health care provider if you should have a one-time screening for abdominal aortic aneurysm (AAA). Diabetes Have regular diabetes screenings. This checks your fasting blood sugar level. Have the screening done: Once every three years after age 86 if you are at a normal weight and have a low risk for diabetes. More often and at a younger age if you are overweight or have a high risk for diabetes. What should I know about preventing infection? Hepatitis B If you have a higher risk for hepatitis B, you should be screened for this virus. Talk with your health care provider to find out if you are at risk for hepatitis B infection. Hepatitis C Blood testing is recommended  for: Everyone born from [birth year] through 1965. Anyone with known risk factors for hepatitis C. Sexually transmitted infections (STIs) You should be screened each year for STIs, including gonorrhea and chlamydia, if: You are sexually active and are younger than 39  years of age. You are older than 39 years of age and your health care provider tells you that you are at risk for this type of infection. Your sexual activity has changed since you were last screened, and you are at increased risk for chlamydia or gonorrhea. Ask your health care provider if you are at risk. Ask your health care provider about whether you are at high risk for HIV. Your health care provider may recommend a prescription medicine to help prevent HIV infection. If you choose to take medicine to prevent HIV, you should first get tested for HIV. You should then be tested every 3 months for as long as you are taking the medicine. Follow these instructions at home: Alcohol use Do not drink alcohol if your health care provider tells you not to drink. If you drink alcohol: Limit how much you have to 0-2 drinks a day. Know how much alcohol is in your drink. In the [place] one drink equals one 12 oz bottle of beer (355 mL), one 5 oz glass of wine (148 mL), or one 1 oz glass of hard liquor (44 mL). Lifestyle Do not use any products that contain nicotine or tobacco. These products include cigarettes, chewing tobacco, and vaping devices, such as e-cigarettes. If you need help quitting, ask your health care provider. Do not use street drugs. Do not share needles. Ask your health care provider for help if you need support or information about quitting drugs. General instructions Schedule regular health, dental, and eye exams. Stay current with your vaccines. Tell your health care provider if: You often feel depressed. You have ever been abused or do not feel safe at home. Summary      Your EKG shows a sinus bradycardia and incomplete  right bundle branch block.  This will need a cardiology evaluation.  Referral was placed today. Adopting a healthy lifestyle and getting preventive care are important in promoting health and wellness. Follow your health care provider's instructions about healthy diet, exercising, and getting tested or screened for diseases. Follow your health care provider's instructions on monitoring your cholesterol and blood pressure. This information is not intended to replace advice given to you by your health care provider. Make sure you discuss any questions you have with your health care provider. Document Revised: 05/14/2020 Document Reviewed: 05/14/2020 Elsevier Patient Education  2022 [place], MD [facility]

## 2021-01-14 NOTE — Patient Instructions (Addendum)
Health Maintenance, Male °Adopting a healthy lifestyle and getting preventive care are important in promoting health and wellness. Ask your health care provider about: °The right schedule for you to have regular tests and exams. °Things you can do on your own to prevent diseases and keep yourself healthy. °What should I know about diet, weight, and exercise? °Eat a healthy diet ° °Eat a diet that includes plenty of vegetables, fruits, low-fat dairy products, and lean protein. °Do not eat a lot of foods that are high in solid fats, added sugars, or sodium. °Maintain a healthy weight °Body mass index (BMI) is a measurement that can be used to identify possible weight problems. It estimates body fat based on height and weight. Your health care provider can help determine your BMI and help you achieve or maintain a healthy weight. °Get regular exercise °Get regular exercise. This is one of the most important things you can do for your health. Most adults should: °Exercise for at least 150 minutes each week. The exercise should increase your heart rate and make you sweat (moderate-intensity exercise). °Do strengthening exercises at least twice a week. This is in addition to the moderate-intensity exercise. °Spend less time sitting. Even light physical activity can be beneficial. °Watch cholesterol and blood lipids °Have your blood tested for lipids and cholesterol at 39 years of age, then have this test every 5 years. °You may need to have your cholesterol levels checked more often if: °Your lipid or cholesterol levels are high. °You are older than 40 years of age. °You are at high risk for heart disease. °What should I know about cancer screening? °Many types of cancers can be detected early and may often be prevented. Depending on your health history and family history, you may need to have cancer screening at various ages. This may include screening for: °Colorectal cancer. °Prostate cancer. °Skin cancer. °Lung  cancer. °What should I know about heart disease, diabetes, and high blood pressure? °Blood pressure and heart disease °High blood pressure causes heart disease and increases the risk of stroke. This is more likely to develop in people who have high blood pressure readings or are overweight. °Talk with your health care provider about your target blood pressure readings. °Have your blood pressure checked: °Every 3-5 years if you are 18-39 years of age. °Every year if you are 40 years old or older. °If you are between the ages of 65 and 75 and are a current or former smoker, ask your health care provider if you should have a one-time screening for abdominal aortic aneurysm (AAA). °Diabetes °Have regular diabetes screenings. This checks your fasting blood sugar level. Have the screening done: °Once every three years after age 45 if you are at a normal weight and have a low risk for diabetes. °More often and at a younger age if you are overweight or have a high risk for diabetes. °What should I know about preventing infection? °Hepatitis B °If you have a higher risk for hepatitis B, you should be screened for this virus. Talk with your health care provider to find out if you are at risk for hepatitis B infection. °Hepatitis C °Blood testing is recommended for: °Everyone born from 1945 through 1965. °Anyone with known risk factors for hepatitis C. °Sexually transmitted infections (STIs) °You should be screened each year for STIs, including gonorrhea and chlamydia, if: °You are sexually active and are younger than 39 years of age. °You are older than 39 years of age and your   health care provider tells you that you are at risk for this type of infection. Your sexual activity has changed since you were last screened, and you are at increased risk for chlamydia or gonorrhea. Ask your health care provider if you are at risk. Ask your health care provider about whether you are at high risk for HIV. Your health care provider  may recommend a prescription medicine to help prevent HIV infection. If you choose to take medicine to prevent HIV, you should first get tested for HIV. You should then be tested every 3 months for as long as you are taking the medicine. Follow these instructions at home: Alcohol use Do not drink alcohol if your health care provider tells you not to drink. If you drink alcohol: Limit how much you have to 0-2 drinks a day. Know how much alcohol is in your drink. In the [place] one drink equals one 12 oz bottle of beer (355 mL), one 5 oz glass of wine (148 mL), or one 1 oz glass of hard liquor (44 mL). Lifestyle Do not use any products that contain nicotine or tobacco. These products include cigarettes, chewing tobacco, and vaping devices, such as e-cigarettes. If you need help quitting, ask your health care provider. Do not use street drugs. Do not share needles. Ask your health care provider for help if you need support or information about quitting drugs. General instructions Schedule regular health, dental, and eye exams. Stay current with your vaccines. Tell your health care provider if: You often feel depressed. You have ever been abused or do not feel safe at home. Summary      Your EKG shows a sinus bradycardia and incomplete right bundle branch block.  This will need a cardiology evaluation.  Referral was placed today. Adopting a healthy lifestyle and getting preventive care are important in promoting health and wellness. Follow your health care provider's instructions about healthy diet, exercising, and getting tested or screened for diseases. Follow your health care provider's instructions on monitoring your cholesterol and blood pressure. This information is not intended to replace advice given to you by your health care provider. Make sure you discuss any questions you have with your health care provider. Document Revised: 05/14/2020 Document Reviewed: 05/14/2020 Elsevier Patient  Education  2022 [employer].

## 2021-01-15 LAB — THYROID PANEL WITH TSH
Free Thyroxine Index: 2.5 (ref 1.4–3.8)
T3 Uptake: 32 % (ref 22–35)
T4, Total: 7.7 ug/dL (ref 4.9–10.5)
TSH: 1.8 mIU/L (ref 0.40–4.50)

## 2021-01-15 LAB — HEPATITIS C ANTIBODY
Hepatitis C Ab: NONREACTIVE
SIGNAL TO CUT-OFF: 0.08 (ref <1.00)

## 2021-05-29 ENCOUNTER — Ambulatory Visit: Payer: BC Managed Care – PPO | Admitting: Internal Medicine

## 2021-05-29 VITALS — BP 128/76 | HR 57 | Temp 98.0°F | Ht 72.0 in | Wt 174.0 lb

## 2021-05-29 DIAGNOSIS — H6982 Other specified disorders of Eustachian tube, left ear: Secondary | ICD-10-CM

## 2021-05-29 DIAGNOSIS — J309 Allergic rhinitis, unspecified: Secondary | ICD-10-CM | POA: Diagnosis not present

## 2021-05-29 DIAGNOSIS — R002 Palpitations: Secondary | ICD-10-CM | POA: Diagnosis not present

## 2021-05-29 MED ORDER — PREDNISONE 10 MG PO TABS: ORAL_TABLET | ORAL | 0 refills | Status: AC

## 2021-05-29 NOTE — Progress Notes (Signed)
Patient ID: Johnny Ellison, male   DOB: 1980/01/29, 41 y.o.   MRN: [id number]        Chief Complaint: follow up new onset nasal allergies, and left ear fullness       HPI:  Johnny Ellison is a 41 y.o. male here with c/o left ear fullness and pressure without pain per se or fever, chills; vertigo, tinnitus  and also denies ST, cough and Does have several wks ongoing nasal allergy symptoms with clearish congestion, itch and sneezing, without cough, swelling or wheezing.   Pt denies polydipsia, polyuria, or new focal neuro s/s.    Pt denies recent wt loss, night sweats, loss of appetite, or other constitutional symptoms  Has seen cardiology with DOE and palpitations thought due to stimulants, sleep deprivation and less exercise in an otherwise healthy young person.         Wt Readings from Last 3 Encounters:  05/29/21 174 lb (78.9 kg)  01/14/21 172 lb (78 kg)  12/26/19 177 lb (80.3 kg)   BP Readings from Last 3 Encounters:  05/29/21 128/76  01/14/21 116/60  12/26/19 116/76         History reviewed. No pertinent past medical history. History reviewed. No pertinent surgical history.  reports that he has never smoked. He has never used smokeless tobacco. He reports current alcohol use of about 4.0 standard drinks per week. He reports that he does not use drugs. family history is not on file. No Known Allergies Current Outpatient Medications on File Prior to Visit  Medication Sig Dispense Refill   albuterol (PROVENTIL HFA;VENTOLIN HFA) 108 (90 Base) MCG/ACT inhaler Inhale 2 puffs every 6 (six) hours as needed into the lungs for wheezing or shortness of breath. (Patient not taking: Reported on 01/14/2021) 1 Inhaler 2   OVER THE COUNTER MEDICATION daily. (Patient not taking: Reported on 01/14/2021)     No current facility-administered medications on file prior to visit.        ROS:  All others reviewed and negative.  Objective        PE:  BP 128/76 (BP Location: Right Arm, Patient Position:  Sitting, Cuff Size: Large)   Pulse (!) 57   Temp 98 F (36.7 C) (Oral)   Ht 6' (1.829 m)   Wt 174 lb (78.9 kg)   SpO2 97%   BMI 23.60 kg/m                 Constitutional: Pt appears in NAD               HENT: Head: NCAT.                Right Ear: External ear normal.                 Left Ear: External ear normal. Bilat tm's with mild erythema.  Max sinus areas non tender.  Pharynx with mild erythema, no exudate               Eyes: . Pupils are equal, round, and reactive to light. Conjunctivae and EOM are normal               Nose: without d/c or deformity               Neck: Neck supple. Gross normal ROM               Cardiovascular: Normal rate and regular rhythm.  Pulmonary/Chest: Effort normal and breath sounds without rales or wheezing.                Abd:  Soft, NT, ND, + BS, no organomegaly               Neurological: Pt is alert. At baseline orientation, motor grossly intact               Skin: Skin is warm. No rashes, no other new lesions, LE edema - none               Psychiatric: Pt behavior is normal without agitation   Micro: none  Cardiac tracings I have personally interpreted today:  none  Pertinent Radiological findings (summarize): none   Lab Results  Component Value Date   WBC 5.7 01/14/2021   HGB 15.8 01/14/2021   HCT 47.2 01/14/2021   PLT 237.0 01/14/2021   GLUCOSE 84 01/14/2021   CHOL 134 01/14/2021   TRIG 89.0 01/14/2021   HDL 41.50 01/14/2021   LDLCALC 74 01/14/2021   ALT 21 01/14/2021   AST 21 01/14/2021   NA 138 01/14/2021   K 4.2 01/14/2021   CL 99 01/14/2021   CREATININE 0.85 01/14/2021   BUN 18 01/14/2021   CO2 31 01/14/2021   TSH 1.80 01/14/2021   HGBA1C 5.4 01/14/2021   Assessment/Plan:  Johnny Ellison is a 41 y.o. White or Caucasian [1] male with  has no past medical history on file.  Allergic rhinitis Mild to mod, for low dose short course prednisone, allegra, nasacort asd,  to f/u any worsening symptoms or  concerns  Acute dysfunction of left eustachian tube Also for muciinex D bid prn,  to f/u any worsening symptoms or concerns  Palpitations Stable overall, continue to follow with expectant management  Followup: Return if symptoms worsen or fail to improve.  Cathlean Cower, MD 06/02/2021 6:45 AM [facility]

## 2021-05-29 NOTE — Patient Instructions (Signed)
You appear to have "left eustachian tube dysfunction"  Ok to take OTC Allegra (or zyrtec), nasacort, and Mucinex D to help open up the tube to let it drain  Ok to take a low dose prednisone for over the weekend as well to at least have it not get worse over the weekend outdoors  Please continue all other medications as before, and refills have been done if requested.  Please have the pharmacy call with any other refills you may need.  Please keep your appointments with your specialists as you may have planned

## 2021-06-02 ENCOUNTER — Encounter: Payer: Self-pay | Admitting: Internal Medicine

## 2021-06-02 DIAGNOSIS — J309 Allergic rhinitis, unspecified: Secondary | ICD-10-CM | POA: Insufficient documentation

## 2021-06-02 DIAGNOSIS — H6982 Other specified disorders of Eustachian tube, left ear: Secondary | ICD-10-CM | POA: Insufficient documentation

## 2021-06-02 NOTE — Assessment & Plan Note (Signed)
Stable overall, continue to follow with expectant management

## 2021-06-02 NOTE — Assessment & Plan Note (Signed)
Also for muciinex D bid prn,  to f/u any worsening symptoms or concerns

## 2021-06-02 NOTE — Assessment & Plan Note (Signed)
Mild to mod, for low dose short course prednisone, allegra, nasacort asd,  to f/u any worsening symptoms or concerns
